# Patient Record
Sex: Male | Born: 1965 | Race: White | Hispanic: No | Marital: Married | Smoking: Former smoker
Health system: Southern US, Community
[De-identification: ages and names within clinical notes are randomized; demographics above are authoritative.]

## PROBLEM LIST (undated history)

## (undated) DIAGNOSIS — I82409 Acute embolism and thrombosis of unspecified deep veins of unspecified lower extremity: Secondary | ICD-10-CM

## (undated) DIAGNOSIS — K219 Gastro-esophageal reflux disease without esophagitis: Secondary | ICD-10-CM

## (undated) DIAGNOSIS — E7800 Pure hypercholesterolemia, unspecified: Secondary | ICD-10-CM

## (undated) DIAGNOSIS — R51 Headache: Secondary | ICD-10-CM

## (undated) DIAGNOSIS — R519 Headache, unspecified: Secondary | ICD-10-CM

## (undated) DIAGNOSIS — Z9989 Dependence on other enabling machines and devices: Secondary | ICD-10-CM

## (undated) DIAGNOSIS — G4733 Obstructive sleep apnea (adult) (pediatric): Secondary | ICD-10-CM

## (undated) DIAGNOSIS — N200 Calculus of kidney: Secondary | ICD-10-CM

## (undated) DIAGNOSIS — I10 Essential (primary) hypertension: Secondary | ICD-10-CM

## (undated) HISTORY — DX: Headache, unspecified: R51.9

## (undated) HISTORY — PX: CYSTOSCOPY/RETROGRADE/URETEROSCOPY/STONE EXTRACTION WITH BASKET: SHX5317

## (undated) HISTORY — PX: BACK SURGERY: SHX140

## (undated) HISTORY — DX: Headache: R51

## (undated) HISTORY — PX: COLONOSCOPY: SHX174

---

## 1998-04-28 ENCOUNTER — Encounter: Admission: RE | Admit: 1998-04-28 | Discharge: 1998-07-27 | Payer: Self-pay | Admitting: *Deleted

## 2003-03-01 ENCOUNTER — Emergency Department (HOSPITAL_COMMUNITY): Admission: EM | Admit: 2003-03-01 | Discharge: 2003-03-02 | Payer: Self-pay

## 2003-03-10 ENCOUNTER — Encounter: Payer: Self-pay | Admitting: Orthopedic Surgery

## 2003-03-10 ENCOUNTER — Ambulatory Visit (HOSPITAL_COMMUNITY): Admission: RE | Admit: 2003-03-10 | Discharge: 2003-03-10 | Payer: Self-pay | Admitting: Orthopedic Surgery

## 2003-03-29 ENCOUNTER — Ambulatory Visit: Admission: RE | Admit: 2003-03-29 | Discharge: 2003-03-29 | Payer: Self-pay | Admitting: Orthopedic Surgery

## 2003-04-02 ENCOUNTER — Emergency Department (HOSPITAL_COMMUNITY): Admission: EM | Admit: 2003-04-02 | Discharge: 2003-04-02 | Payer: Self-pay

## 2003-04-06 ENCOUNTER — Encounter: Payer: Self-pay | Admitting: Urology

## 2003-04-06 ENCOUNTER — Inpatient Hospital Stay (HOSPITAL_COMMUNITY): Admission: EM | Admit: 2003-04-06 | Discharge: 2003-04-07 | Payer: Self-pay | Admitting: Emergency Medicine

## 2003-04-06 ENCOUNTER — Encounter: Payer: Self-pay | Admitting: Emergency Medicine

## 2003-04-06 ENCOUNTER — Encounter: Payer: Self-pay | Admitting: Internal Medicine

## 2005-06-03 ENCOUNTER — Emergency Department (HOSPITAL_COMMUNITY): Admission: EM | Admit: 2005-06-03 | Discharge: 2005-06-03 | Payer: Self-pay | Admitting: Family Medicine

## 2005-07-05 ENCOUNTER — Ambulatory Visit (HOSPITAL_COMMUNITY): Admission: RE | Admit: 2005-07-05 | Discharge: 2005-07-05 | Payer: Self-pay | Admitting: Orthopedic Surgery

## 2005-08-19 ENCOUNTER — Encounter: Admission: RE | Admit: 2005-08-19 | Discharge: 2005-11-08 | Payer: Self-pay | Admitting: Orthopedic Surgery

## 2005-09-05 HISTORY — PX: SHOULDER ARTHROSCOPY: SHX128

## 2005-09-25 ENCOUNTER — Ambulatory Visit (HOSPITAL_BASED_OUTPATIENT_CLINIC_OR_DEPARTMENT_OTHER): Admission: RE | Admit: 2005-09-25 | Discharge: 2005-09-25 | Payer: Self-pay | Admitting: Orthopedic Surgery

## 2006-08-06 ENCOUNTER — Encounter: Admission: RE | Admit: 2006-08-06 | Discharge: 2006-08-06 | Payer: Self-pay | Admitting: Internal Medicine

## 2006-08-20 ENCOUNTER — Ambulatory Visit (HOSPITAL_COMMUNITY): Admission: RE | Admit: 2006-08-20 | Discharge: 2006-08-20 | Payer: Self-pay | Admitting: Internal Medicine

## 2006-09-09 ENCOUNTER — Encounter: Admission: RE | Admit: 2006-09-09 | Discharge: 2006-09-09 | Payer: Self-pay | Admitting: Internal Medicine

## 2007-05-29 ENCOUNTER — Encounter: Admission: RE | Admit: 2007-05-29 | Discharge: 2007-05-29 | Payer: Self-pay | Admitting: Internal Medicine

## 2007-06-08 HISTORY — PX: ANTERIOR CERVICAL DECOMP/DISCECTOMY FUSION: SHX1161

## 2007-07-07 ENCOUNTER — Ambulatory Visit (HOSPITAL_COMMUNITY): Admission: RE | Admit: 2007-07-07 | Discharge: 2007-07-08 | Payer: Self-pay | Admitting: Neurosurgery

## 2007-10-28 ENCOUNTER — Encounter: Admission: RE | Admit: 2007-10-28 | Discharge: 2007-10-28 | Payer: Self-pay | Admitting: Internal Medicine

## 2009-04-06 ENCOUNTER — Emergency Department (HOSPITAL_COMMUNITY): Admission: EM | Admit: 2009-04-06 | Discharge: 2009-04-06 | Payer: Self-pay | Admitting: Emergency Medicine

## 2009-04-12 ENCOUNTER — Emergency Department (HOSPITAL_COMMUNITY): Admission: EM | Admit: 2009-04-12 | Discharge: 2009-04-12 | Payer: Self-pay | Admitting: Family Medicine

## 2009-05-02 ENCOUNTER — Encounter: Admission: RE | Admit: 2009-05-02 | Discharge: 2009-05-02 | Payer: Self-pay | Admitting: Internal Medicine

## 2009-05-03 ENCOUNTER — Encounter: Admission: RE | Admit: 2009-05-03 | Discharge: 2009-05-03 | Payer: Self-pay | Admitting: Occupational Medicine

## 2009-05-14 ENCOUNTER — Ambulatory Visit (HOSPITAL_COMMUNITY): Admission: RE | Admit: 2009-05-14 | Discharge: 2009-05-14 | Payer: Self-pay | Admitting: Neurosurgery

## 2009-08-01 ENCOUNTER — Encounter: Admission: RE | Admit: 2009-08-01 | Discharge: 2009-10-30 | Payer: Self-pay | Admitting: Neurosurgery

## 2009-12-09 ENCOUNTER — Emergency Department (HOSPITAL_COMMUNITY): Admission: EM | Admit: 2009-12-09 | Discharge: 2009-12-09 | Payer: Self-pay | Admitting: Emergency Medicine

## 2010-03-05 ENCOUNTER — Ambulatory Visit (HOSPITAL_COMMUNITY): Admission: RE | Admit: 2010-03-05 | Discharge: 2010-03-05 | Payer: Self-pay | Admitting: Gastroenterology

## 2010-07-29 ENCOUNTER — Encounter (HOSPITAL_BASED_OUTPATIENT_CLINIC_OR_DEPARTMENT_OTHER): Payer: Self-pay | Admitting: Internal Medicine

## 2010-10-12 LAB — POCT RAPID STREP A (OFFICE): Streptococcus, Group A Screen (Direct): NEGATIVE

## 2010-11-20 NOTE — Op Note (Signed)
NAMEMarland Kitchen  ELVA, BREAKER NO.:  [id number]   MEDICAL RECORD NO.:  [id number]          PATIENT TYPE:  INP   LOCATION:  3172                         FACILITY:  [facility]   PHYSICIAN:  Danae Orleans. Venetia Maxon, M.D.  DATE OF BIRTH:  1966-07-03   DATE OF PROCEDURE:  07/07/2007  DATE OF DISCHARGE:                               OPERATIVE REPORT   PREOPERATIVE DIAGNOSIS:  Herniated cervical disk with spondylosis,  degenerative disk disease and radiculopathy C5-C6 and C6-C7 levels.   POSTOPERATIVE DIAGNOSIS:  Herniated cervical disk with spondylosis,  degenerative disk disease and radiculopathy C5-C6 and C6-C7 levels.   PROCEDURE:  Anterior cervical decompression and fusion C5-C6 and C6-C7  with allograft bone graft, morselized bone autograft and anterior  cervical plate.   SURGEON:  Maeola Harman, MD.   ASSISTANT:  Coletta Memos, MD and Georgiann Cocker, RN.   ANESTHESIA:  General endotracheal anesthesia.   BLOOD LOSS:  Minimal.   COMPLICATIONS:  None.   DISPOSITION:  Recovery.   INDICATIONS:  Dan Scearce is a 45 year old man with right arm pain and  herniated cervical disks at C5-C6 and C6-C7 levels.  It was elected take  him to surgery for anterior cervical decompression and fusion at these  affected levels.   PROCEDURE:  Mr. Archey is brought to the operating room.  Following  satisfactory and uncomplicated induction of general endotracheal  anesthesia and placement of intravenous lines, the patient was placed in  the supine position on the operating table.  His neck was placed in  slight extension.  He was placed in 10 pounds halter traction and the  anterior neck was then prepped and draped in usual sterile fashion.  Because the PACS radiology system was not available, intraoperative C-  arm fluoroscopy was used to confirm the level and placement of hardware.  The area of planned incision was marked and confirmed on this location  on x-ray.  Subsequently, an incision was  made from the midline to the  anterior border of the sternocleidomastoid muscle, carried sharply  through platysma layer.  Subplatysmal dissection was performed exposing  the anterior border sternocleidomastoid muscle using blunt dissection  where the carotid sheath was kept lateral and the trachea and esophagus  kept medial exposing the anterior cervical spine.  A bent spinal needle  was placed at the C5-C6 level and this was confirmed on intraoperative x-  ray.  Subsequently, longus colli muscles were taken down from the  anterior cervical spine using electrocautery and Key elevator and  subsequently shadow line retractors were placed exposing the C5-C6 and  C6-C7 levels.  Each of the interspaces were incised, disk material was  removed in piecemeal fashion and endplates were stripped of residual  disk material.  The distraction pins were then placed, one at C6 and one  at C7 and using gentle distraction the interspace at C6-C7 was then  opened using a high-speed drill under the operating microscope.  The  endplates were decorticated and uncinate spurs drilled down.  The  posterior longitudinal ligament was then removed in a piecemeal fashion.  The endplates were cleared of  residual disk material.  The right C7  nerve root was decompressed.  There was cephalad herniated disk material  which was causing significant narrowing around the nerve root and this  was fully decompressed.  The left C6-C7 foramen did not appear to have  nearly as much compression.  This was also decompressed.  Hemostasis was  assured and then after trial sizing an 8 mm bone allograft which was  fashioned with a high-speed drill, packed with morcellized bone  autograft and demineralized bone matrix inserted in the interspace and  countersunk appropriately.  Attention was then turned to the C5-C6 level  where similar decompression was performed.  On this side there was  fragment of disk material to the right which  was causing a right C6  nerve root compression and the nerve root was decompressed as was the  central spinal cord dura and left C6 nerve root as well.  Hemostasis was  again assured and then after trial sizing, a 7 mm allograft bone wedge  was selected fashioned with a high-speed drill, packed morselized bone  autograft and demineralized bone matrix inserted in the interspace and  countersunk appropriately.  Traction weight was removed.  A 34 mm  Trestle anterior cervical plate was then affixed to the anterior  cervical spine using variable angle 14 mm screws, two at C5, two at C6  and two at C7.  All screws had excellent purchase.  Their position was  confirmed on lateral foot C-arm fluoroscopy which visualized the upper  aspect of the implant.  The wound was then inspected.  Soft tissues were  inspected, found to be in good repair.  Hemostasis was assured.  The  platysma layer was then closed with 3-0 Vicryl sutures.  Skin edges were  approximated 3-0 Vicryl interrupted inverted sutures.  The wound was  dressed with Dermabond.  The patient was extubated in the operating room  and taken to recovery in stable satisfactory condition having tolerated  his operation well.  All counts were correct.      Danae Orleans. Venetia Maxon, M.D.  Electronically Signed     JDS/MEDQ  D:  07/07/2007  T:  07/07/2007  Job:  [id number]

## 2010-11-23 NOTE — Op Note (Signed)
NAME:  Donald Hoover, Donald Hoover                         ACCOUNT NO.:  [id number]   MEDICAL RECORD NO.:  [id number]                   PATIENT TYPE:  INP   LOCATION:  0381                                 FACILITY:  [facility]   PHYSICIAN:  Lucrezia Starch. Ovidio Hanger, M.D.           DATE OF BIRTH:  Apr 29, 1966   DATE OF PROCEDURE:  04/06/2003  DATE OF DISCHARGE:                                 OPERATIVE REPORT   PREOPERATIVE DIAGNOSES:  Left ureterolithiasis with high-grade obstruction.   OPERATION/PROCEDURE:  1. Cystourethroscopy with left ureteroscopy with holmium laser lithotripsy     and stone basket extraction.  2. Placement left double-J stent.   SURGEON:  Lucrezia Starch. Earlene Plater, M.D.   ANESTHESIA:  LMA.   ESTIMATED BLOOD LOSS:  Negligible.   TUBES:  26 cm, 6-French Cook double pigtail stent.   COMPLICATIONS:  None.   INDICATIONS:  [name] is a very nice 45 year old white male who recently  had a heal fracture and developed a DVT and has been maintained on Coumadin.  He subsequently developed left flank pain, nausea and vomiting and was seen  the first of the week and was found to have what was felt to be a 4-6 mm  left distal ureteral calculus.  He was in an attempt to pass the stone,  especially being on the Coumadin.  He had only been on Coumadin for  approximately a week and needed to stay on it for a period of time for his  DVT and last night developed severe pain, nausea, vomiting, and was seen in  the emergency room and had continued pain despite IV Dilaudid.  He was  admitted.  With the continued pain, it was felt that stone manipulation was  indicated.  His liver enzymes were somewhat elevated.  After being worked  up, his mediastinum was slightly widened on chest x-ray.  That is also being  worked up.  Of note, his PT was only 15.3 and INR was 1.3, PTT 45.  It was  felt that it was safe to proceed.   DESCRIPTION OF PROCEDURE:  The patient was placed in the supine position.  After  proper LMA anesthesia, he was placed in the dorsal lithotomy position  and prepped and draped with Betadine in the sterile fashion.  Care was  maintained with his heel fracture.  Cystourethroscopy was performed with a  22.5-French Olympus panendoscope.  There was some mild trilobar hypertrophy  of the bladder, smooth wall and efflux of urine was noted in the right  ureteral orifice.  Under fluoroscopic guidance, a 0.03 [place] ______ coated  guidewire was placed, passed the stone on the left side which was quite  distal and into the kidney and ureteroscopy was then performed with a short,  thin ACMI ureteroscope.  The orifice was carefully dilated with the tip of  the scope and the stone was visualized.  Utilizing a 365 laser fiber on  a  setting of 0.5 and repetition rate of 5, stone was visually fragmented on  the monitor and the fragments were extracted with a nitinol basket.  Reinspection of the lower third ureter revealed no perforation, no  significant bleeding and no significant stone fragments.  The ureteroscope  was visually removed.  It was felt that retrograde pyelogram was probably  not indicated and under fluoroscopic guidance, a 26 cm, 6-French Cook double  pigtail stent was placed and noted to be in good position in the left renal  pelvis within the bladder.  Additional a pull-out string was attached.  The bladder was drained,  panendoscope was removed.  Fluoroscopic confirmation was performed.  The  string was taped to the penis and the patient was taken to the recovery room  stable and stone will be submitted for stone analysis.                                               Ronald L. Ovidio Hanger, M.D.    RLD/MEDQ  D:  04/06/2003  T:  04/06/2003  Job:  [id number]   cc:   Barry Dienes. Eloise Harman, M.D.  [address]  [place]  [place] [address]  Fax: [phone]

## 2010-11-23 NOTE — Op Note (Signed)
NAME:  Donald Hoover, Donald Hoover               ACCOUNT NO.:  [id number]   MEDICAL RECORD NO.:  [id number]          PATIENT TYPE:  AMB   LOCATION:  DSC                          FACILITY:  [facility]   PHYSICIAN:  Mila Homer. Sherlean Foot, M.D. DATE OF BIRTH:  [birth year]   DATE OF PROCEDURE:  09/25/2005  DATE OF DISCHARGE:                                 OPERATIVE REPORT   PREOPERATIVE DIAGNOSIS:  Left shoulder impingement syndrome.   POSTOPERATIVE DIAGNOSIS:  Left shoulder impingement syndrome.   OPERATION PERFORMED:  Left shoulder arthroscopy, subacromial decompression  and distal clavicle resection.   SURGEON:  Mila Homer. Sherlean Foot, M.D.   ASSISTANT:  None.   ANESTHESIA:  General.   INDICATIONS FOR PROCEDURE:  The patient is a white male with failure of  conservative measures for shoulder impingement syndrome.  Informed consent  was obtained.   DESCRIPTION OF PROCEDURE:  The patient was laid supine and administered  general anesthesia.  I then placed trocar and cannula.  Diagnostic  arthroscopy of the glenohumeral joint from the posterior portal revealed no  labral tears, no pathology of really any kind.  I then redirected the scope  to the subacromial space from the posterior portal.  There was some mild  bursitis.  The shaver was used through the direct lateral portal and  performed __________ estimated at 6 mm in total with the arm sitting at  comfortable resting position.  I released the CA ligament and cleaned off  the undersurface of the acromion with the Arthrex shoulder debridement.  I  then used a 4.0 mm cylindrical bur to perform an aggressive anterolateral  __________ to the [facility] joint and removing the distal clavicle on the inferior  surface.  This afforded excellent decompression.  I then irrigated, closed  with 4-0 nylon sutures, dressed with Xeroform, 4 x 4s, ABDs, 2-inch silk  tape and simple sling.   COMPLICATIONS:  None.   DRAINS:  None.           ______________________________  Mila Homer. Sherlean Foot, M.D.     SDL/MEDQ  D:  09/25/2005  T:  09/26/2005  Job:  [id number]

## 2010-11-23 NOTE — Consult Note (Signed)
NAME:  Donald Hoover, Donald Hoover                         ACCOUNT NO.:  [id number]   MEDICAL RECORD NO.:  [id number]                   PATIENT TYPE:  INP   LOCATION:  0381                                 FACILITY:  [facility]   PHYSICIAN:  Donald Hoover, M.D.                DATE OF BIRTH:  [birth year]   DATE OF CONSULTATION:  04/06/2003  DATE OF DISCHARGE:                                   CONSULTATION   REQUESTING PHYSICIAN:  Donald Hoover, M.D. of urology.   CHIEF COMPLAINT:  Kidney stone.  Cleared medically for surgery.   HISTORY OF PRESENT ILLNESS:  Donald Hoover is a 45 year old male who was  previously healthy.  He does have a history of hyperlipidemia and had  previously been on Lipitor for six months and stopped this on his own six  months ago.  He fell off a ladder four or five weeks ago and had a  comminuted fracture of his left heel.  He around the same time developed a  sore throat and was treated with Augmentin.  Since that time, he has had  loose watery diarrhea and was diagnosed in the past week with C. diff  colitis.  He was started on vancomycin one or two days ago.  In addition, he  was noted to have some increased calf swelling and he had a Doppler  ultrasound of his left calf within the last week.  This showed a calf DVT,  and he has been placed on Lovenox and Coumadin approximately one week ago.  Then more recently, he has developed some urinary symptoms and left flank  pain.  This became severe and he presented to the emergency room over the  weekend.  He was found to have an obstructing 6 mm left ureteral stone.  He  is admitted today for an ureteroscopy and hopeful removal of the kidney  stone.  We were asked to see him to clear medically and to address his  medical issues.   PAST MEDICAL HISTORY:  1. Left foot calcaneal fracture.  2. He denies history of hypertension, diabetes.  He has had no history of     heart disease or stroke.  He does have a history of  hyperlipidemia and he     was on Lipitor for a time.  3. He had a previous finger surgery, but no other surgeries.  He has never     had a hospitalization before.   ALLERGIES:  No known allergies.   MEDICATIONS:  1. Morphine PCA.  2. At home, he was taking Coumadin 5 mg daily and Lovenox 100 mg subcu twice     a day.  He had been on Dilaudid taking 4 mg every three to four hours for     the last several days.  He was just started on vancomycin 125 mg q.i.d.     one day ago.  SOCIAL HISTORY:  His wife, Donald Hoover, is a Engineer, civil (consulting).  They have two children, a  daughter who is 14, and a son who is 74, they are in good health.  No  tobacco use except maybe a rare cigar.  He drinks about one case of beer per  month, but has had no alcohol in the last month.  He denies any recreational  or illicit drugs.  He was using Aleve two tablets twice a day for 10 days  initially after his heel fracture, then he had been off of it, then he had  been using it again for three or four days in the last week.   FAMILY HISTORY:  Mother died at age 82 of lung cancer, she was a heavy  smoker.  Father is alive at age 21, but he did have a stroke at age 59, and  has a history of carotid endarterectomy at age 27 with a repeat several  years ago, and a fem/pop bypass in the year 2000.  He has three brothers and  four sisters.  There is no known coronary disease in any of his brothers or  sisters.   REVIEW OF SYSTEMS:  He has been having 10 stools per day, although this has  slowed some with the addition of the Dilaudid over the few days.  He has had  no chest pain or shortness of breath.  He has had normal appetite.  He has  had some low-grade temperatures ranging from 100 to 100.4 for the last few  days.  He denies any current shortness of breath, sore throat, cough, or  cold symptoms.  He has had some mild swelling of his left foot and ankle,  but he has been neurovascularly intact.   PHYSICAL EXAMINATION:  VITAL  SIGNS:  Temperature 98.4, pulse 86, respiratory  rate 16, blood pressure 134/75, 96% saturation on room air, weight 210  pounds.  GENERAL:  He is in no acute distress.  He is alert and oriented.  HEENT:  There is no icterus.  NECK:  No JVD.  LUNGS:  Clear to auscultation bilaterally.  HEART:  Regular rate and rhythm with no murmur.  ABDOMEN:  Soft.  He has a little bit of left lower quadrant tenderness, but  no rebound or guarding, no masses.  EXTREMITIES:  There is no significant edema.  He has normal pulses  bilaterally.  He has some maturing ecchymoses of his left foot and heel.   LABORATORY DATA:  CT of the abdomen and pelvis done on April 02, 2003,  shows moderate left hydronephrosis, a 2 mm left kidney stone, and a 6 mm  distal ureteral stone.  He also had some mild diverticulosis changes.  Laboratory data today shows a PTT of 45, INR 1.3.  Sodium 140, potassium  3.5, chloride 106, CO2 24, BUN 11, creatinine 1.7, glucose 115, alk phos  122, AST 141, ALT 341, total protein of 7.4, albumin 3.9, calcium 9.3, white  count is 9.5 with 74% segs, 16% lymphocytes, 8% monocytes.  Hemoglobin 14,  platelet count 278,000.  Urinalysis showed 0 to 2 white cells, 21 to 50 red  cells, few bacteria.  Dip stick showed large blood and small leukocyte.  EKG  shows normal sinus rhythm, there is an isolated Q-wave in lead 3, but no  contiguous lead Q-waves.  He has some mild nonspecific flattening of his T-  waves in lead 3, AVF, but no clearcut ischemic changes.  Chest x-ray is  being done currently.   ASSESSMENT AND PLAN:  A previously healthy 45 year old male with left heel  fracture, left calf deep vein thrombosis, Clostridium difficile colitis,  left kidney stones, and abnormal liver function tests.  The first order of  business is that I believe that the patient is cleared medically for procedure today.  His EKG does show some mild nonspecific findings, but he  has had no definite ischemic  symptoms or signs.  I would recommend resuming  his Lovenox this afternoon if this is okay with urology or possibly give him  a heparin drip and resume Coumadin when it is deemed safe per urology.  This  will address his calf deep vein thrombosis.  I would continue him on oral  vancomycin for his recently diagnosed Clostridium difficile colitis.  As far  as his elevated liver function tests are concerned, I believe this may be  due to his recent Aleve use and possibly due to the Dilaudid.  We will check  acute and chronic viral serologies and check an abdominal ultrasound.  It  should be noted that a CT scan of his abdomen done four days ago was  negative for any liver or gallbladder findings.  I would follow liver  function tests again in the morning.  Dr. Eloise Harman will be made aware of the  patient's admission and will follow with you.                                               Donald A. Waynard Edwards, M.D.    MAP/MEDQ  D:  04/06/2003  T:  04/06/2003  Job:  [id number]

## 2011-04-12 LAB — CBC
HCT: 41.2
Hemoglobin: 14.4
MCHC: 35.1
MCV: 83.7
Platelets: 227
RBC: 4.92
RDW: 12.9
WBC: 5.8

## 2011-04-12 LAB — BASIC METABOLIC PANEL
BUN: 16
CO2: 27
Calcium: 9.8
Chloride: 106
Creatinine, Ser: 0.95
GFR calc Af Amer: >60
GFR calc non Af Amer: >60
Glucose, Bld: 108 — ABNORMAL HIGH
Potassium: 4.2
Sodium: 139

## 2011-04-12 LAB — TYPE AND SCREEN
ABO/RH(D): O POS
Antibody Screen: NEGATIVE

## 2011-04-12 LAB — ABO/RH: ABO/RH(D): O POS

## 2011-12-20 ENCOUNTER — Ambulatory Visit (HOSPITAL_BASED_OUTPATIENT_CLINIC_OR_DEPARTMENT_OTHER): Payer: 59 | Attending: Internal Medicine | Admitting: Radiology

## 2011-12-20 VITALS — Ht 72.0 in | Wt 225.0 lb

## 2011-12-20 DIAGNOSIS — I498 Other specified cardiac arrhythmias: Secondary | ICD-10-CM | POA: Insufficient documentation

## 2011-12-20 DIAGNOSIS — G4737 Central sleep apnea in conditions classified elsewhere: Secondary | ICD-10-CM | POA: Insufficient documentation

## 2011-12-20 DIAGNOSIS — R0683 Snoring: Secondary | ICD-10-CM

## 2011-12-20 DIAGNOSIS — G4733 Obstructive sleep apnea (adult) (pediatric): Secondary | ICD-10-CM | POA: Insufficient documentation

## 2011-12-20 DIAGNOSIS — G4730 Sleep apnea, unspecified: Secondary | ICD-10-CM

## 2011-12-21 DIAGNOSIS — I498 Other specified cardiac arrhythmias: Secondary | ICD-10-CM

## 2011-12-21 DIAGNOSIS — G4733 Obstructive sleep apnea (adult) (pediatric): Secondary | ICD-10-CM

## 2011-12-21 DIAGNOSIS — G4737 Central sleep apnea in conditions classified elsewhere: Secondary | ICD-10-CM

## 2011-12-21 DIAGNOSIS — R0989 Other specified symptoms and signs involving the circulatory and respiratory systems: Secondary | ICD-10-CM

## 2011-12-21 DIAGNOSIS — R0609 Other forms of dyspnea: Secondary | ICD-10-CM

## 2011-12-21 DIAGNOSIS — G4761 Periodic limb movement disorder: Secondary | ICD-10-CM

## 2011-12-21 NOTE — Procedures (Deleted)
NAME:  Donald Hoover, Donald Hoover               ACCOUNT NO.:  [id number]  MEDICAL RECORD NO.:  [id number]          PATIENT TYPE:  OUT  LOCATION:  SLEEP CENTER                 FACILITY:  [facility]  PHYSICIAN:  Shoshana Johal D. Denim Kalmbach, MD, FCCP, FACPDATE OF BIRTH:  02/18/1966  DATE OF STUDY:  12/20/2011                           NOCTURNAL POLYSOMNOGRAM  REFERRING PHYSICIAN:  Daniel G. Paterson, M.D.  INDICATION FOR STUDY:  Hypersomnia with sleep apnea.  EPWORTH SLEEPINESS SCORE:  14/24.  BMI 30.5, weight 225 pounds, height 72 inches, neck 17.5 inches.  MEDICATIONS:  Home medications are charted and reviewed.  SLEEP ARCHITECTURE:  Total sleep time 325.5 minutes with sleep efficiency 74.5%.  Stage I was 13.2%, stage II 73%, stage III absent, REM 13.8% of total sleep time.  Sleep latency 22 minutes, REM latency 121 minutes, awake after sleep onset 89 minutes.  Arousal index 36.7.  BEDTIME MEDICATION:  None.  RESPIRATORY DATA:  Apnea-hypopnea index (AHI) 28.8 per hour.  A total of 156 events was scored including 59 obstructive apneas, 21 central apneas, 76 hypopneas.  The events were associated with supine sleep position and REM.  REM/AHI 22.7 per hour.  This was a diagnostic NPSG protocol as ordered and CPAP titration was not done.  OXYGEN DATA:  Moderate to loud snoring with oxygen desaturation to a nadir of 82% and mean oxygen saturation through the study of 94.1% on room air.  CARDIAC DATA:  Sinus rhythm with sinus bradycardia.  Mean heart rate through the study was 58.9 per minute.  MOVEMENT-PARASOMNIA:  A total of 352 limb jerks were counted of which 22 were associated with arousal or awakening for a periodic limb movement with arousal index of 4.1 per hour.  Bathroom x1.  IMPRESSIONS-RECOMMENDATIONS: 1. 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  REM/AHI 22.7     per hour.  Moderate to loud snoring with oxygen  desaturation to a     nadir of 82% and mean oxygen saturation through the study of 94.1%     on room air. 2. This is a diagnostic NPSG protocol as ordered.  Return for     dedicated CPAP titration is an option. 3. Periodic limb movement with arousal syndrome.  A total of 352 limb     jerks were counted of which 22 were     associated with arousals or awakening for periodic limb movement     with arousal index of 4.1 per hour. 4. Sustained sinus bradycardia with mean heart rate through the study     of 58.9 per hour.     Lasonia Casino D. Aston Lieske, MD, FCCP, FACP Diplomate, American Board of Sleep Medicine    CDY/MEDQ  D:  12/21/2011 11:39:11  T:  12/21/2011 19:50:19  Job:  [id number] 

## 2011-12-21 NOTE — Procedures (Signed)
NAME:  Donald Hoover, Donald Hoover NO.:  [id number]  MEDICAL RECORD NO.:  [id number]          PATIENT TYPE:  OUT  LOCATION:  SLEEP CENTER                 FACILITY:  [facility]  PHYSICIAN:  Izyk Marty D. Maple Hudson, MD, FCCP, FACPDATE OF BIRTH:  06/28/1966  DATE OF STUDY:  12/20/2011                           NOCTURNAL POLYSOMNOGRAM  REFERRING PHYSICIAN:  Barry Dienes. Eloise Harman, M.D.  INDICATION FOR STUDY:  Hypersomnia with sleep apnea.  EPWORTH SLEEPINESS SCORE:  14/24.  BMI 30.5, weight 225 pounds, height 72 inches, neck 17.5 inches.  MEDICATIONS:  Home medications are charted and reviewed.  SLEEP ARCHITECTURE:  Total sleep time 325.5 minutes with sleep efficiency 74.5%.  Stage I was 13.2%, stage II 73%, stage III absent, REM 13.8% of total sleep time.  Sleep latency 22 minutes, REM latency 121 minutes, awake after sleep onset 89 minutes.  Arousal index 36.7.  BEDTIME MEDICATION:  None.  RESPIRATORY DATA:  Apnea-hypopnea index (AHI) 28.8 per hour.  A total of 156 events was scored including 59 obstructive apneas, 21 central apneas, 76 hypopneas.  The events were associated with supine sleep position and REM.  REM/AHI 22.7 per hour.  This was a diagnostic NPSG protocol as ordered and CPAP titration was not done.  OXYGEN DATA:  Moderate to loud snoring with oxygen desaturation to a nadir of 82% and mean oxygen saturation through the study of 94.1% on room air.  CARDIAC DATA:  Sinus rhythm with sinus bradycardia.  Mean heart rate through the study was 58.9 per minute.  MOVEMENT-PARASOMNIA:  A total of 352 limb jerks were counted of which 22 were associated with arousal or awakening for a periodic limb movement with arousal index of 4.1 per hour.  Bathroom x1.  IMPRESSIONS-RECOMMENDATIONS: 1. 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  REM/AHI 22.7     per hour.  Moderate to loud snoring with oxygen  desaturation to a     nadir of 82% and mean oxygen saturation through the study of 94.1%     on room air. 2. This is a diagnostic NPSG protocol as ordered.  Return for     dedicated CPAP titration is an option. 3. Periodic limb movement with arousal syndrome.  A total of 352 limb     jerks were counted of which 22 were     associated with arousals or awakening for periodic limb movement     with arousal index of 4.1 per hour. 4. Sustained sinus bradycardia with mean heart rate through the study     of 58.9 per hour.     Emilina Smarr D. Maple Hudson, MD, [facility], FACP Diplomate, American Board of Sleep Medicine    CDY/MEDQ  D:  12/21/2011 11:39:11  T:  12/21/2011 19:50:19  Job:  [id number]

## 2011-12-23 ENCOUNTER — Ambulatory Visit (HOSPITAL_COMMUNITY)
Admission: RE | Admit: 2011-12-23 | Discharge: 2011-12-23 | Disposition: A | Discharge status: Home / Self Care | Payer: 59 | Source: Ambulatory Visit | Attending: Gastroenterology | Admitting: Gastroenterology

## 2011-12-23 ENCOUNTER — Encounter (HOSPITAL_COMMUNITY): Payer: Self-pay

## 2011-12-23 ENCOUNTER — Encounter (HOSPITAL_COMMUNITY)
Admission: RE | Disposition: A | Discharge status: Home / Self Care | Payer: Self-pay | Source: Ambulatory Visit | Attending: Gastroenterology

## 2011-12-23 DIAGNOSIS — Z79899 Other long term (current) drug therapy: Secondary | ICD-10-CM | POA: Insufficient documentation

## 2011-12-23 DIAGNOSIS — E785 Hyperlipidemia, unspecified: Secondary | ICD-10-CM | POA: Insufficient documentation

## 2011-12-23 DIAGNOSIS — K449 Diaphragmatic hernia without obstruction or gangrene: Secondary | ICD-10-CM | POA: Insufficient documentation

## 2011-12-23 DIAGNOSIS — I10 Essential (primary) hypertension: Secondary | ICD-10-CM | POA: Insufficient documentation

## 2011-12-23 DIAGNOSIS — K222 Esophageal obstruction: Secondary | ICD-10-CM | POA: Insufficient documentation

## 2011-12-23 DIAGNOSIS — R1310 Dysphagia, unspecified: Secondary | ICD-10-CM | POA: Insufficient documentation

## 2011-12-23 DIAGNOSIS — K219 Gastro-esophageal reflux disease without esophagitis: Secondary | ICD-10-CM | POA: Insufficient documentation

## 2011-12-23 HISTORY — DX: Essential (primary) hypertension: I10

## 2011-12-23 HISTORY — PX: BALLOON DILATION: SHX5330

## 2011-12-23 HISTORY — DX: Acute embolism and thrombosis of unspecified deep veins of unspecified lower extremity: I82.409

## 2011-12-23 HISTORY — DX: Gastro-esophageal reflux disease without esophagitis: K21.9

## 2011-12-23 HISTORY — PX: ESOPHAGOGASTRODUODENOSCOPY: SHX5428

## 2011-12-23 HISTORY — DX: Pure hypercholesterolemia, unspecified: E78.00

## 2011-12-23 SURGERY — EGD (ESOPHAGOGASTRODUODENOSCOPY)
Anesthesia: Moderate Sedation

## 2011-12-23 MED ORDER — FENTANYL CITRATE 0.05 MG/ML IJ SOLN
INTRAMUSCULAR | Status: AC
  Filled 2011-12-23: qty 4

## 2011-12-23 MED ORDER — FENTANYL NICU IV SYRINGE 50 MCG/ML
INJECTION | INTRAMUSCULAR | Status: DC | PRN
  Administered 2011-12-23 (×3): 25 ug via INTRAVENOUS

## 2011-12-23 MED ORDER — DIPHENHYDRAMINE HCL 50 MG/ML IJ SOLN
INTRAMUSCULAR | Status: DC | PRN
  Administered 2011-12-23: 25 mg via INTRAVENOUS

## 2011-12-23 MED ORDER — MIDAZOLAM HCL 10 MG/2ML IJ SOLN
INTRAMUSCULAR | Status: AC
  Filled 2011-12-23: qty 4

## 2011-12-23 MED ORDER — DIPHENHYDRAMINE HCL 50 MG/ML IJ SOLN
INTRAMUSCULAR | Status: AC
  Filled 2011-12-23: qty 1

## 2011-12-23 MED ORDER — ESOMEPRAZOLE MAGNESIUM 40 MG PO CPDR: 40.0000 mg | DELAYED_RELEASE_CAPSULE | Freq: Two times a day (BID) | ORAL | Status: DC

## 2011-12-23 MED ORDER — MIDAZOLAM HCL 10 MG/2ML IJ SOLN
INTRAMUSCULAR | Status: DC | PRN
  Administered 2011-12-23 (×3): 2 mg via INTRAVENOUS

## 2011-12-23 MED ORDER — SODIUM CHLORIDE 0.9 % IV SOLN
Freq: Once | INTRAVENOUS | Status: AC
  Administered 2011-12-23: 500 mL via INTRAVENOUS

## 2011-12-23 MED ORDER — BUTAMBEN-TETRACAINE-BENZOCAINE 2-2-14 % EX AERO
INHALATION_SPRAY | CUTANEOUS | Status: DC | PRN
  Administered 2011-12-23: 2 via TOPICAL

## 2011-12-23 NOTE — Discharge Instructions (Signed)
Endoscopy °Care After °Please read the instructions outlined below and refer to this sheet in the next few weeks. These discharge instructions provide you with general information on caring for yourself after you leave the hospital. Your doctor may also give you specific instructions. While your treatment has been planned according to the most current medical practices available, unavoidable complications occasionally occur. If you have any problems or questions after discharge, please call Dr. Outlaw ([facility]) at [phone]. ° °HOME CARE INSTRUCTIONS °Activity °· You may resume your regular activity but move at a slower pace for the next 24 hours.  °· Take frequent rest periods for the next 24 hours.  °· Walking will help expel (get rid of) the air and reduce the bloated feeling in your abdomen.  °· No driving for 24 hours (because of the anesthesia (medicine) used during the test).  °· You may shower.  °· Do not sign any important legal documents or operate any machinery for 24 hours (because of the anesthesia used during the test).  °Nutrition °· Drink plenty of fluids.  °· You may resume your normal diet.  °· Begin with a light meal and progress to your normal diet.  °· Avoid alcoholic beverages for 24 hours or as instructed by your caregiver.  °Medications °You may resume your normal medications unless your caregiver tells you otherwise. °What you can expect today °· You may experience abdominal discomfort such as a feeling of fullness or "gas" pains.  °· You may experience a sore throat for 2 to 3 days. This is normal. Gargling with salt water may help this.  °·  °SEEK IMMEDIATE MEDICAL CARE IF: °· You have excessive nausea (feeling sick to your stomach) and/or vomiting.  °· You have severe abdominal pain and distention (swelling).  °· You have trouble swallowing.  °· You have a temperature over 100° F (37.8° C).  °· You have rectal bleeding or vomiting of blood.  °Document Released:  02/06/2004 Document Revised: 03/06/2011 Document Reviewed: 08/19/2007 °ExitCare® Patient Information ©2012 [employer]. °

## 2011-12-23 NOTE — Op Note (Signed)
[facility] [address] [place], [place]  [address]  ENDOSCOPY PROCEDURE REPORT  PATIENT:  Donald Hoover, Donald Hoover  MR#:  [id number] BIRTHDATE:  18-Jun-1966, 46 yrs. old  GENDER:  male  ENDOSCOPIST:  Willis Modena, MD Referred by:  Jarome Matin, M.D.  PROCEDURE DATE:  12/23/2011 PROCEDURE:  EGD with balloon dilatation ASA CLASS:  Class II INDICATIONS:  GERD, dysphagia  MEDICATIONS:   Benadryl 25 mg IV, Versed 6 mg IV, Fentanyl 75 mcg IV, Cetacaine spray x 2  DESCRIPTION OF PROCEDURE:   After the risks benefits and alternatives of the procedure were thoroughly explained, informed consent was obtained.  The Pentax Gastroscope [id number] endoscope was introduced through the mouth and advanced to the second portion of the duodenum, without limitations.  The instrument was slowly withdrawn as the mucosa was fully examined.  <<PROCEDUREIMAGES>>  FINDINGS:  Small hiatal hernia with widely patent Schatzki's ring; after conclusion of our diagnostic exam (as below), the ring was dilated to 18mm with TTS balloon dilating catheter; there was minimal resistance and no mucosal disruption post dilatation. Remainder of exam was normal; normal remainder of esophagus (no Barrett's mucosa, esophagitis, features of eosinophilic esophagitis), stomach, pylorus and duodenum to the second portion.  ENDOSCOPIC IMPRESSION:    1.  Widely patent Schatzki's ring, dilation to 18mm with minimal resistance. 2.  Small hiatal hernia. 3.  Otherwise normal endoscopy.  Suspect dysphagia is largely GERD-related.  RECOMMENDATIONS:      1.  Watch for potential complications of procedure. 2.  Conservative antireflux measures. 3.  Continued PPI therapy. 4.  Follow-up in office in 4-6 weeks.  REPEAT EXAM:  No  ______________________________ Willis Modena  CC:  n. eSIGNEDWillis Modena at 12/23/2011 09:25 AM  Freddie Apley, [phone]

## 2011-12-23 NOTE — H&P (Signed)
Patient interval history reviewed.  Patient examined again.  There has been no change from documented H/P dated 11/22/11 (scanned into chart from our office) except as documented above.  CC:  GERD, dysphagia  HPI:  Chronic GERD.  6-8 months of solid-predominant dysphagia.  Some improvement of GERD with increase in Nexium dosing.  PMHx:  HTN, HLD, blood clots, left shoulder surgery, anterior cervical spine surgery with plates  ALL:  NKDA  FHx:  Colon polyps (father)  PE: GEN:  NAD HEENT:  NCAT ABD:  Soft  Assessment: 1.  GERD 2.  Dysphagia.  Plan:  1.  Endoscopy with possible biopsies, possible esophageal dilatation. 2.  Risks (bleeding, infection, bowel perforation that could require surgery, sedation-related changes in cardiopulmonary systems), benefits (identification and possible treatment of source of symptoms, exclusion of certain causes of symptoms), and alternatives (watchful waiting, radiographic imaging studies, empiric medical treatment) of upper endoscopy (EGD) were explained to patient in detail and he wishes to proceed.

## 2011-12-24 ENCOUNTER — Encounter (HOSPITAL_COMMUNITY): Payer: Self-pay | Admitting: Gastroenterology

## 2012-02-16 ENCOUNTER — Ambulatory Visit (HOSPITAL_BASED_OUTPATIENT_CLINIC_OR_DEPARTMENT_OTHER): Payer: 59 | Attending: Internal Medicine

## 2012-02-16 VITALS — Ht 72.0 in | Wt 225.0 lb

## 2012-02-16 DIAGNOSIS — G4710 Hypersomnia, unspecified: Secondary | ICD-10-CM | POA: Insufficient documentation

## 2012-02-16 DIAGNOSIS — R259 Unspecified abnormal involuntary movements: Secondary | ICD-10-CM | POA: Insufficient documentation

## 2012-02-16 DIAGNOSIS — G4733 Obstructive sleep apnea (adult) (pediatric): Secondary | ICD-10-CM

## 2012-02-23 DIAGNOSIS — G4710 Hypersomnia, unspecified: Secondary | ICD-10-CM

## 2012-02-23 DIAGNOSIS — G4730 Sleep apnea, unspecified: Secondary | ICD-10-CM

## 2012-02-24 NOTE — Procedures (Signed)
NAME:  Donald Hoover, Donald Hoover NO.:  [id number]  MEDICAL RECORD NO.:  [id number]          PATIENT TYPE:  OUT  LOCATION:  SLEEP CENTER                 FACILITY[facility]  [facility]  PHYSICIAN:  Machi Whittaker D. Maple Hudson, MD, FCCP, FACPDATE OF BIRTH:  21-Sep-1965  DATE OF STUDY:  02/16/2012                           NOCTURNAL POLYSOMNOGRAM  REFERRING PHYSICIAN:  Barry Dienes. Eloise Harman, M.D.  INDICATION FOR STUDY:  Hypersomnia with sleep apnea.  EPWORTH SLEEPINESS SCORE:  14/24.  BMI 30.5, weight 225 pounds, height 72 inches, neck 18 inches.  MEDICATIONS:  Home medications are charted and reviewed.  The baseline diagnostic NPSG on December 20, 2011, had recorded an AHI of 28.8 per hour.  Body weight was 225 pounds.  CPAP titration is now requested.  SLEEP ARCHITECTURE:  Total sleep time 329.5 minutes with sleep efficiency 89.5%.  Stage I was 6.2%, stage II 68.6%.  Stage III 0.8%, REM 24.4% of total sleep time.  Sleep latency 10.5 minutes, REM latency 80.5 minutes, awake after sleep onset 27.5 minutes.  Arousal index 6.6. Bedtime medication:  None.  RESPIRATORY DATA:  CPAP titration protocol.  CPAP was titrated to 13 CWP, AHI 1.8 per hour.  He wore a [employer], full face mask with heated humidifier and a C-Flex setting of 2.  OXYGEN DATA:  Snoring was presented at final CPAP with mean oxygen saturation 94.8% on room air.  CARDIAC DATA:  Sinus rhythm with occasional PVC.  MOVEMENT-PARASOMNIA:  A total of 350 limb jerks were counted during this CPAP titration study, of which 9 were associated with arousal or awakening for periodic limb movement with arousal index of 1.6 per hour. No bathroom trips.  IMPRESSION-RECOMMENDATION: 1. Successful CPAP titration to 13 CWP, AHI of 1.8 per hour.  He wore     a medium/large Respironics Amara full face mask with heated     humidifier. 2. Frequent limb jerks were noted, of which only a few were associated     with arousals or awakening.   Increased limb jerks are commonly seen     during CPAP titration.  If this pattern persists as a disruptive     issue in the home     sleep environment, then consider specific therapy such as ReQuip or     Mirapex if clinically appropriate. 3. Baseline diagnostic NPSG at December 20, 2011 and recorded an AHI of     28.8 per hour.     Dhillon Comunale D. Maple Hudson, MD, [facility], FACP Diplomate, American Board of Sleep Medicine    CDY/MEDQ  D:  02/23/2012 13:29:25  T:  02/24/2012 03:32:38  Job:  [id number]

## 2012-03-12 ENCOUNTER — Emergency Department (HOSPITAL_COMMUNITY): Payer: 59

## 2012-03-12 ENCOUNTER — Emergency Department (HOSPITAL_COMMUNITY)
Admission: EM | Admit: 2012-03-12 | Discharge: 2012-03-12 | Disposition: A | Discharge status: Home / Self Care | Payer: 59 | Attending: Emergency Medicine | Admitting: Emergency Medicine

## 2012-03-12 ENCOUNTER — Encounter (HOSPITAL_COMMUNITY): Payer: Self-pay | Admitting: Emergency Medicine

## 2012-03-12 DIAGNOSIS — K219 Gastro-esophageal reflux disease without esophagitis: Secondary | ICD-10-CM | POA: Insufficient documentation

## 2012-03-12 DIAGNOSIS — Z86718 Personal history of other venous thrombosis and embolism: Secondary | ICD-10-CM | POA: Insufficient documentation

## 2012-03-12 DIAGNOSIS — E7800 Pure hypercholesterolemia, unspecified: Secondary | ICD-10-CM | POA: Insufficient documentation

## 2012-03-12 DIAGNOSIS — Z7982 Long term (current) use of aspirin: Secondary | ICD-10-CM | POA: Insufficient documentation

## 2012-03-12 DIAGNOSIS — M549 Dorsalgia, unspecified: Secondary | ICD-10-CM | POA: Insufficient documentation

## 2012-03-12 DIAGNOSIS — G4730 Sleep apnea, unspecified: Secondary | ICD-10-CM | POA: Insufficient documentation

## 2012-03-12 DIAGNOSIS — I10 Essential (primary) hypertension: Secondary | ICD-10-CM | POA: Insufficient documentation

## 2012-03-12 DIAGNOSIS — Z79899 Other long term (current) drug therapy: Secondary | ICD-10-CM | POA: Insufficient documentation

## 2012-03-12 MED ORDER — DIAZEPAM 5 MG PO TABS
10.0000 mg | ORAL_TABLET | Freq: Once | ORAL | Status: AC
  Administered 2012-03-12: 10 mg via ORAL
  Filled 2012-03-12: qty 2

## 2012-03-12 MED ORDER — HYDROMORPHONE HCL PF 1 MG/ML IJ SOLN
1.0000 mg | Freq: Once | INTRAMUSCULAR | Status: AC
  Administered 2012-03-12: 1 mg via INTRAVENOUS
  Filled 2012-03-12: qty 1

## 2012-03-12 MED ORDER — ONDANSETRON HCL 4 MG/2ML IJ SOLN
4.0000 mg | Freq: Once | INTRAMUSCULAR | Status: AC
  Administered 2012-03-12: 4 mg via INTRAVENOUS
  Filled 2012-03-12: qty 2

## 2012-03-12 MED ORDER — METHOCARBAMOL 100 MG/ML IJ SOLN: 1000.0000 mg | Freq: Once | INTRAMUSCULAR | Status: DC

## 2012-03-12 MED ORDER — SODIUM CHLORIDE 0.9 % IV SOLN
INTRAVENOUS | Status: DC
  Administered 2012-03-12: 17:00:00 via INTRAVENOUS

## 2012-03-12 MED ORDER — DIAZEPAM 5 MG PO TABS: 10.0000 mg | ORAL_TABLET | Freq: Two times a day (BID) | ORAL | Status: AC

## 2012-03-12 MED ORDER — OXYCODONE-ACETAMINOPHEN 10-325 MG PO TABS: 1.0000 | ORAL_TABLET | ORAL | Status: AC | PRN

## 2012-03-12 NOTE — ED Notes (Signed)
Reports severe R lower back pain and bil hip pain--lower back pain started months ago, and then 2 weeks ago started to have bil hip pain; reports that he had CT scan done today to verify no kidney stone--reports was negative and urine was negative; reports that Dr Venetia Maxon viewed the CT scan--and stated that Dr Venetia Maxon reports that he has a ruptured disc; sent to ED for pain meds and possible MRI; denies loss in bowel function; ambulatory at triage

## 2012-03-12 NOTE — ED Provider Notes (Signed)
History   Scribed for [name], MD, the patient was seen in room TR05C/TR05C . This chart was scribed by Lewanda Rife.    CSN: [id number]  Arrival date & time 03/12/12  1516   First MD Initiated Contact with Patient 03/12/12 1540      Chief Complaint  Patient presents with  . Back Pain    (Consider location/radiation/quality/duration/timing/severity/associated sxs/prior treatment) The history is provided by the patient and the spouse.   Donald Hoover is a 46 y.o. male who presents to the Emergency Department complaining of severe right flank pain for the past 2 months. Pt reports passing a kidney stone 1 month ago. Pt reports pain is at it's worse today. Pt reports he had a CT urogram today to verify he did not have any kidney stones and Dr. Venetia Maxon reported a ruptured disc. Pt denies dysuria and difficulty with bowel movements. Pt reports bilateral hip pain with movement and weight bearing. Dr. Venetia Maxon recommended he come to the ED today for an MRI and pain medications. Pt reports he has a hx of a ruptured cervical disc.  Past Medical History  Diagnosis Date  . Hypertension   . Hypercholesteremia   . DVT (deep venous thrombosis)     left calf  . GERD (gastroesophageal reflux disease)   . Sleep apnea     Past Surgical History  Procedure Date  . Neck surgery   . Shoulder surgery   . Kidney stone removal   . Colonoscopy   . Esophagogastroduodenoscopy 12/23/2011    Procedure: ESOPHAGOGASTRODUODENOSCOPY (EGD);  Surgeon: Willis Modena, MD;  Location: [facility];  Service: Endoscopy;  Laterality: N/A;  . Balloon dilation 12/23/2011    Procedure: BALLOON DILATION;  Surgeon: Willis Modena, MD;  Location: [facility];  Service: Endoscopy;  Laterality: N/A;    Family History  Problem Relation Age of Onset  . Colon polyps Father     History  Substance Use Topics  . Smoking status: Former Games developer  . Smokeless tobacco: Not on file   Comment: quit 20 years ago (2013)    . Alcohol Use: 3.6 oz/week    6 Cans of beer per week     socially      Review of Systems  Constitutional: Negative.   HENT: Negative.   Respiratory: Negative.   Cardiovascular: Negative.   Gastrointestinal: Negative.   Musculoskeletal: Negative.   Skin: Negative.   Neurological: Negative.   Hematological: Negative.   Psychiatric/Behavioral: Negative.   All other systems reviewed and are negative.    Allergies  Review of patient's allergies indicates no known allergies.  Home Medications   Current Outpatient Rx  Name Route Sig Dispense Refill  . ASPIRIN EC 81 MG PO TBEC Oral Take 162 mg by mouth daily.    Marland Kitchen ESOMEPRAZOLE MAGNESIUM 40 MG PO CPDR Oral Take 1 capsule (40 mg total) by mouth 2 (two) times daily.    Marland Kitchen EZETIMIBE 10 MG PO TABS Oral Take 10 mg by mouth daily.    . OMEGA-3 FATTY ACIDS 1000 MG PO CAPS Oral Take 2 g by mouth 2 (two) times daily.     Marland Kitchen GLUCOSAMINE PO Oral Take 2 tablets by mouth daily.    . MULTI-VITAMIN/MINERALS PO TABS Oral Take 1 tablet by mouth daily.    Marland Kitchen OLMESARTAN MEDOXOMIL 20 MG PO TABS Oral Take 20 mg by mouth daily.    Marland Kitchen PRAVASTATIN SODIUM 40 MG PO TABS Oral Take 40 mg by mouth daily.  BP 136/89  Pulse 83  Temp 97.9 F (36.6 C) (Oral)  Resp 18  SpO2 94%  Physical Exam  Nursing note and vitals reviewed. Constitutional: He is oriented to person, place, and time. He appears well-developed and well-nourished.  Non-toxic appearance. No distress.  HENT:  Head: Normocephalic and atraumatic.  Eyes: Conjunctivae, EOM and lids are normal. Pupils are equal, round, and reactive to light.  Neck: Normal range of motion. Neck supple. No tracheal deviation present. No mass present.  Cardiovascular: Normal rate, regular rhythm and normal heart sounds.  Exam reveals no gallop.   No murmur heard. Pulmonary/Chest: Effort normal and breath sounds normal. No stridor. No respiratory distress. He has no decreased breath sounds. He has no wheezes. He  has no rhonchi. He has no rales.  Abdominal: Soft. Normal appearance and bowel sounds are normal. He exhibits no distension. There is no tenderness. There is no rebound and no CVA tenderness.  Musculoskeletal: Normal range of motion. He exhibits no edema and no tenderness.       No CVA tenderness  Neurological: He is alert and oriented to person, place, and time. He has normal strength. No cranial nerve deficit or sensory deficit. GCS eye subscore is 4. GCS verbal subscore is 5. GCS motor subscore is 6.  Skin: Skin is warm and dry. No abrasion and no rash noted.  Psychiatric: He has a normal mood and affect. His speech is normal and behavior is normal.    ED Course  Procedures (including critical care time)  Labs Reviewed - No data to display No results found.   No diagnosis found.    MDM  Patient given pain medications and will have MRI of the back. Will be sent to the [facility]      I personally performed the services described in this documentation, which was scribed in my presence. The recorded information has been reviewed and considered.    [name], MD 03/12/12 [phone]

## 2012-03-12 NOTE — ED Provider Notes (Signed)
5:15 pm.:  Patient reports Dilaudid did not offer any relief. Additional medications ordered, including Valium and more Dilaudid. Awaiting MRI.  6:30 - patient returning from MRI, reports pain is improved.  6:50 - MRI showing mild bulging discs only. Pain returning. Discussed discharge home which patient and spouse are comfortable with. He has been ambulatory without difficulty. He will follow up with Dr. Venetia Maxon.  Rodena Medin, PA-C 03/12/12 [phone]

## 2012-03-17 NOTE — ED Provider Notes (Signed)
Medical screening examination/treatment/procedure(s) were performed by non-physician practitioner and as supervising physician I was immediately available for consultation/collaboration.  Marquetta Weiskopf T Brenna Friesenhahn, MD 03/17/12 0754 

## 2012-10-11 ENCOUNTER — Ambulatory Visit (INDEPENDENT_AMBULATORY_CARE_PROVIDER_SITE_OTHER): Payer: 59 | Admitting: Emergency Medicine

## 2012-10-11 VITALS — BP 138/80 | HR 92 | Temp 99.0°F | Resp 18 | Ht 70.5 in | Wt 233.0 lb

## 2012-10-11 DIAGNOSIS — S0512XA Contusion of eyeball and orbital tissues, left eye, initial encounter: Secondary | ICD-10-CM

## 2012-10-11 DIAGNOSIS — S0510XA Contusion of eyeball and orbital tissues, unspecified eye, initial encounter: Secondary | ICD-10-CM

## 2012-10-11 MED ORDER — HYDROCODONE-ACETAMINOPHEN 5-325 MG PO TABS: 1.0000 | ORAL_TABLET | ORAL | Status: DC | PRN

## 2012-10-11 MED ORDER — OFLOXACIN 0.3 % OP SOLN: 1.0000 [drp] | OPHTHALMIC | Status: DC

## 2012-10-11 NOTE — Patient Instructions (Addendum)
Eye Contusion  An eye contusion is a deep bruise of the eye. This is often called a "black eye." Contusions are the result of an injury that caused bleeding under the skin. The contusion may turn blue, purple, or yellow. Minor injuries will give you a painless contusion, but more severe contusions may stay painful and swollen for a few weeks. If the eye contusion only involves the eyelids and tissues around the eye, the injured area will get better within a few days to weeks. However, eye contusions can be serious and affect the eyeball and sight.  CAUSES    Blunt injury or trauma to the face or eye area.   A forehead injury that causes the blood under the skin to work its way down to the eyelids.   Rubbing the eyes due to irritation.  SYMPTOMS    Swelling and redness around the eye.   Bruising around the eye.   Tenderness, soreness, or pain around the eye.   Blurry vision.   Tearing.   Eyeball redness.  DIAGNOSIS   A diagnosis is usually based on a thorough exam of the eye and surrounding area. The eye must be looked at carefully to make sure it is not injured and to make sure nothing else will threaten your vision. A vision test may be done. An X-ray or computed tomography (CT) scan may be needed to determine if there are any associated injuries, such as broken bones (fractures).  TREATMENT   If there is an injury to the eye, treatment will be determined by the nature of the injury.  HOME CARE INSTRUCTIONS    Put ice on the injured area.   Put ice in a plastic bag.   Place a towel between your skin and the bag.   Leave the ice on for 15 to 20 minutes, 3 to 4 times a day.   If it is determined that there is no injury to the eye, you may continue normal activities.   Sunglasses may be worn to protect your eyes from bright light if light is uncomfortable.   Sleep with your head elevated. You can put an extra pillow under your head. This may help with discomfort.   Only take over-the-counter or  prescription medicines for pain, discomfort, or fever as directed by your caregiver. Do not take aspirin for the first few days. This may increase bruising.  SEEK IMMEDIATE MEDICAL CARE IF:    You have any form of vision loss.   You have double vision.   You feel nauseous.   You feel dizzy, sleepy, or like you will faint.   You have any fluid discharge from the eye or your nose.   You have swelling and discoloration that does not fade.  MAKE SURE YOU:    Understand these instructions.   Will watch your condition.   Will get help right away if you are not doing well or get worse.  Document Released: 06/21/2000 Document Revised: 09/16/2011 Document Reviewed: 05/10/2011  ExitCare Patient Information 2013 [employer].

## 2012-10-11 NOTE — Progress Notes (Signed)
[facility] [address], [place] [address] [phone]  Date:  10/11/2012   Name:  Donald Hoover   DOB:  1965-12-04   MRN:  [id number]  PCP:  Garlan Fillers, MD    Chief Complaint: Eye Injury   History of Present Illness:  Donald Hoover is a 47 y.o. very pleasant male patient who presents with the following:  Injured today when a limb he was clearing struck him in the left eye.  Has pain and photophobia.  Vision intact by confrontation.  Denies foreign body sensation.  No improvement with over the counter medications or other home remedies. Denies other complaint or health concern today.   There is no problem list on file for this patient.   Past Medical History  Diagnosis Date  . Hypertension   . Hypercholesteremia   . DVT (deep venous thrombosis)     left calf  . GERD (gastroesophageal reflux disease)   . Sleep apnea     Past Surgical History  Procedure Laterality Date  . Neck surgery    . Shoulder surgery    . Kidney stone removal    . Colonoscopy    . Esophagogastroduodenoscopy  12/23/2011    Procedure: ESOPHAGOGASTRODUODENOSCOPY (EGD);  Surgeon: Willis Modena, MD;  Location: [facility];  Service: Endoscopy;  Laterality: N/A;  . Balloon dilation  12/23/2011    Procedure: BALLOON DILATION;  Surgeon: Willis Modena, MD;  Location: [facility];  Service: Endoscopy;  Laterality: N/A;    History  Substance Use Topics  . Smoking status: Former Games developer  . Smokeless tobacco: Not on file     Comment: quit 20 years ago (2013)  . Alcohol Use: 3.6 oz/week    6 Cans of beer per week     Comment: socially    Family History  Problem Relation Age of Onset  . Colon polyps Father     No Known Allergies  Medication list has been reviewed and updated.  Current Outpatient Prescriptions on File Prior to Visit  Medication Sig Dispense Refill  . aspirin EC 81 MG tablet Take 162 mg by mouth daily.      Marland Kitchen esomeprazole (NEXIUM) 40 MG  capsule Take 1 capsule (40 mg total) by mouth 2 (two) times daily.      Marland Kitchen ezetimibe (ZETIA) 10 MG tablet Take 10 mg by mouth daily.      . fish oil-omega-3 fatty acids 1000 MG capsule Take 2 g by mouth 2 (two) times daily.       Marland Kitchen GLUCOSAMINE PO Take 2 tablets by mouth daily.      . Multiple Vitamins-Minerals (MULTIVITAMIN WITH MINERALS) tablet Take 1 tablet by mouth daily.      Marland Kitchen olmesartan (BENICAR) 20 MG tablet Take 20 mg by mouth daily.      . pravastatin (PRAVACHOL) 40 MG tablet Take 40 mg by mouth daily.       No current facility-administered medications on file prior to visit.    Review of Systems:  As per HPI, otherwise negative.    Physical Examination: Filed Vitals:   10/11/12 1507  BP: 138/80  Pulse: 92  Temp: 99 F (37.2 C)  Resp: 18   Filed Vitals:   10/11/12 1507  Height: 5' 10.5" (1.791 m)  Weight: 233 lb (105.688 kg)   Body mass index is 32.95 kg/(m^2). Ideal Body Weight: Weight in (lb) to have BMI = 25: 176.4   GEN: WDWN, NAD, Non-toxic, Alert &  Oriented x 3 HEENT: Atraumatic, Normocephalic.  No fluorescein uptake.  No visible foreign body.  PRRERLA EOMI Ears and Nose: No external deformity. EXTR: No clubbing/cyanosis/edema NEURO: Normal gait.  PSYCH: Normally interactive. Conversant. Not depressed or anxious appearing.  Calm demeanor.    Assessment and Plan: Contusion eye [facility] follow up tomorrow vicodin   Signed,  Phillips Odor, MD

## 2012-10-12 NOTE — Progress Notes (Signed)
Reviewed and agree.

## 2014-09-07 ENCOUNTER — Observation Stay (HOSPITAL_COMMUNITY)
Admission: EM | Admit: 2014-09-07 | Discharge: 2014-09-08 | Disposition: A | Discharge status: Home / Self Care | Payer: No Typology Code available for payment source | Attending: Cardiology | Admitting: Cardiology

## 2014-09-07 ENCOUNTER — Encounter (HOSPITAL_COMMUNITY): Payer: Self-pay | Admitting: *Deleted

## 2014-09-07 ENCOUNTER — Emergency Department (HOSPITAL_COMMUNITY): Payer: No Typology Code available for payment source

## 2014-09-07 DIAGNOSIS — F4321 Adjustment disorder with depressed mood: Secondary | ICD-10-CM

## 2014-09-07 DIAGNOSIS — M79601 Pain in right arm: Secondary | ICD-10-CM | POA: Insufficient documentation

## 2014-09-07 DIAGNOSIS — I10 Essential (primary) hypertension: Secondary | ICD-10-CM | POA: Diagnosis not present

## 2014-09-07 DIAGNOSIS — R072 Precordial pain: Principal | ICD-10-CM | POA: Insufficient documentation

## 2014-09-07 DIAGNOSIS — R200 Anesthesia of skin: Secondary | ICD-10-CM | POA: Diagnosis not present

## 2014-09-07 DIAGNOSIS — Z79899 Other long term (current) drug therapy: Secondary | ICD-10-CM | POA: Diagnosis not present

## 2014-09-07 DIAGNOSIS — G4730 Sleep apnea, unspecified: Secondary | ICD-10-CM | POA: Insufficient documentation

## 2014-09-07 DIAGNOSIS — Z86718 Personal history of other venous thrombosis and embolism: Secondary | ICD-10-CM | POA: Diagnosis not present

## 2014-09-07 DIAGNOSIS — K219 Gastro-esophageal reflux disease without esophagitis: Secondary | ICD-10-CM | POA: Diagnosis not present

## 2014-09-07 DIAGNOSIS — Z7982 Long term (current) use of aspirin: Secondary | ICD-10-CM | POA: Diagnosis not present

## 2014-09-07 DIAGNOSIS — R0789 Other chest pain: Secondary | ICD-10-CM

## 2014-09-07 DIAGNOSIS — Z87891 Personal history of nicotine dependence: Secondary | ICD-10-CM | POA: Insufficient documentation

## 2014-09-07 DIAGNOSIS — R079 Chest pain, unspecified: Secondary | ICD-10-CM | POA: Diagnosis present

## 2014-09-07 DIAGNOSIS — R202 Paresthesia of skin: Secondary | ICD-10-CM | POA: Insufficient documentation

## 2014-09-07 DIAGNOSIS — Z634 Disappearance and death of family member: Secondary | ICD-10-CM | POA: Diagnosis not present

## 2014-09-07 DIAGNOSIS — E780 Pure hypercholesterolemia: Secondary | ICD-10-CM | POA: Insufficient documentation

## 2014-09-07 HISTORY — DX: Dependence on other enabling machines and devices: Z99.89

## 2014-09-07 HISTORY — DX: Obstructive sleep apnea (adult) (pediatric): G47.33

## 2014-09-07 LAB — BASIC METABOLIC PANEL
Anion gap: 8 (ref 5–15)
BUN: 14 mg/dL (ref 6–23)
CO2: 22 mmol/L (ref 19–32)
Calcium: 8.9 mg/dL (ref 8.4–10.5)
Chloride: 109 mmol/L (ref 96–112)
Creatinine, Ser: 1.15 mg/dL (ref 0.50–1.35)
GFR calc Af Amer: 85 mL/min — ABNORMAL LOW (ref >90)
GFR calc non Af Amer: 74 mL/min — ABNORMAL LOW (ref >90)
Glucose, Bld: 128 mg/dL — ABNORMAL HIGH (ref 70–99)
Potassium: 3.6 mmol/L (ref 3.5–5.1)
Sodium: 139 mmol/L (ref 135–145)

## 2014-09-07 LAB — I-STAT TROPONIN, ED
Troponin i, poc: 0 ng/mL (ref 0.00–0.08)
Troponin i, poc: 0 ng/mL (ref 0.00–0.08)

## 2014-09-07 LAB — CBC
HCT: 38.8 % — ABNORMAL LOW (ref 39.0–52.0)
Hemoglobin: 13.1 g/dL (ref 13.0–17.0)
MCH: 29.2 pg (ref 26.0–34.0)
MCHC: 33.8 g/dL (ref 30.0–36.0)
MCV: 86.6 fL (ref 78.0–100.0)
Platelets: 193 10*3/uL (ref 150–400)
RBC: 4.48 MIL/uL (ref 4.22–5.81)
RDW: 13.4 % (ref 11.5–15.5)
WBC: 6.2 10*3/uL (ref 4.0–10.5)

## 2014-09-07 LAB — APTT: aPTT: 27 seconds (ref 24–37)

## 2014-09-07 LAB — PROTIME-INR
INR: 1.03 (ref 0.00–1.49)
Prothrombin Time: 13.6 seconds (ref 11.6–15.2)

## 2014-09-07 LAB — TROPONIN I: Troponin I: <0.03 ng/mL (ref <0.031)

## 2014-09-07 MED ORDER — PANTOPRAZOLE SODIUM 40 MG PO TBEC
40.0000 mg | DELAYED_RELEASE_TABLET | Freq: Every day | ORAL | Status: DC
  Administered 2014-09-08: 40 mg via ORAL
  Filled 2014-09-07: qty 1

## 2014-09-07 MED ORDER — ONDANSETRON HCL 4 MG/2ML IJ SOLN: 4.0000 mg | Freq: Four times a day (QID) | INTRAMUSCULAR | Status: DC | PRN

## 2014-09-07 MED ORDER — IRBESARTAN 150 MG PO TABS
150.0000 mg | ORAL_TABLET | Freq: Every day | ORAL | Status: DC
  Administered 2014-09-08: 150 mg via ORAL
  Filled 2014-09-07: qty 1

## 2014-09-07 MED ORDER — NITROGLYCERIN 2 % TD OINT
1.0000 [in_us] | TOPICAL_OINTMENT | Freq: Once | TRANSDERMAL | Status: AC
  Administered 2014-09-07: 1 [in_us] via TOPICAL
  Filled 2014-09-07: qty 1

## 2014-09-07 MED ORDER — ASPIRIN EC 81 MG PO TBEC
162.0000 mg | DELAYED_RELEASE_TABLET | Freq: Every day | ORAL | Status: DC
  Administered 2014-09-08: 162 mg via ORAL
  Filled 2014-09-07: qty 2

## 2014-09-07 MED ORDER — EZETIMIBE 10 MG PO TABS
10.0000 mg | ORAL_TABLET | Freq: Every day | ORAL | Status: DC
  Administered 2014-09-08: 10 mg via ORAL
  Filled 2014-09-07 (×2): qty 1

## 2014-09-07 MED ORDER — SODIUM CHLORIDE 0.9 % IV SOLN
1000.0000 mL | INTRAVENOUS | Status: DC
  Administered 2014-09-07: 1000 mL via INTRAVENOUS

## 2014-09-07 MED ORDER — ASPIRIN 300 MG RE SUPP
300.0000 mg | RECTAL | Status: AC
  Filled 2014-09-07: qty 1

## 2014-09-07 MED ORDER — ASPIRIN 81 MG PO CHEW
162.0000 mg | CHEWABLE_TABLET | Freq: Once | ORAL | Status: AC
  Administered 2014-09-07: 162 mg via ORAL
  Filled 2014-09-07: qty 2

## 2014-09-07 MED ORDER — ACETAMINOPHEN 325 MG PO TABS: 650.0000 mg | ORAL_TABLET | ORAL | Status: DC | PRN

## 2014-09-07 MED ORDER — NITROGLYCERIN 0.4 MG SL SUBL
0.4000 mg | SUBLINGUAL_TABLET | SUBLINGUAL | Status: AC | PRN
  Administered 2014-09-07 (×3): 0.4 mg via SUBLINGUAL
  Filled 2014-09-07: qty 1

## 2014-09-07 MED ORDER — NITROGLYCERIN 0.4 MG SL SUBL
0.4000 mg | SUBLINGUAL_TABLET | SUBLINGUAL | Status: DC | PRN
Start: 2014-09-07 — End: 2014-09-08

## 2014-09-07 MED ORDER — ACETAMINOPHEN 325 MG PO TABS
650.0000 mg | ORAL_TABLET | Freq: Once | ORAL | Status: AC
  Administered 2014-09-07: 650 mg via ORAL
  Filled 2014-09-07: qty 2

## 2014-09-07 MED ORDER — HEPARIN SODIUM (PORCINE) 5000 UNIT/ML IJ SOLN
5000.0000 [IU] | Freq: Three times a day (TID) | INTRAMUSCULAR | Status: DC
  Administered 2014-09-08 (×2): 5000 [IU] via SUBCUTANEOUS
  Filled 2014-09-07 (×5): qty 1

## 2014-09-07 MED ORDER — ASPIRIN 81 MG PO CHEW: 324.0000 mg | CHEWABLE_TABLET | Freq: Once | ORAL | Status: DC

## 2014-09-07 MED ORDER — ASPIRIN 81 MG PO CHEW
324.0000 mg | CHEWABLE_TABLET | ORAL | Status: AC
  Administered 2014-09-08: 324 mg via ORAL
  Filled 2014-09-07: qty 4

## 2014-09-07 MED ORDER — PRAVASTATIN SODIUM 40 MG PO TABS
40.0000 mg | ORAL_TABLET | Freq: Every day | ORAL | Status: DC
  Administered 2014-09-08: 40 mg via ORAL
  Filled 2014-09-07 (×2): qty 1

## 2014-09-07 NOTE — ED Notes (Signed)
Pt states hes been under a lot of stress, lost his father Friday night. Pt also states found out Monday his best friend died as well. Pt states CP started Monday, went to be seen at a different ER. Was seen there for CP, given 2 nitro and kept in ER for observation and did serial troponins x4. Pt was sent home from ER this morning. Again developed chest pain this morning at 11am again. Pt is in NAD, AAOX4. Pain is radiating to right arm and jaw with numbness and tingling sensation. Denies sweating, N/V/D, LOC, SOB. Pain is 5-6/10.

## 2014-09-07 NOTE — H&P (Signed)
Patient ID: Donald Hoover MRN: [id number], DOB/AGE: 49-Jun-1967   Admit date: 09/07/2014   Primary Physician: Garlan Fillers, MD Primary Cardiologist: New (scheduled for new patient appointment with Dr. [place] Lions 09/09/2014)  Pt. Profile:  49 year old male with history of treated hypertension and hyperlipidemia presenting to the ED with complaints of chest pain  Problem List  Past Medical History  Diagnosis Date  . Hypertension   . Hypercholesteremia   . DVT (deep venous thrombosis)     left calf  . GERD (gastroesophageal reflux disease)   . Sleep apnea     Past Surgical History  Procedure Laterality Date  . Neck surgery    . Shoulder surgery    . Kidney stone removal    . Colonoscopy    . Esophagogastroduodenoscopy  12/23/2011    Procedure: ESOPHAGOGASTRODUODENOSCOPY (EGD);  Surgeon: Willis Modena, MD;  Location: [facility];  Service: Endoscopy;  Laterality: N/A;  . Balloon dilation  12/23/2011    Procedure: BALLOON DILATION;  Surgeon: Willis Modena, MD;  Location: [facility];  Service: Endoscopy;  Laterality: N/A;     Allergies  No Known Allergies  HPI The patient is a 49 year old male followed by Dr. Ivery Quale with a history of treated hypertension and hyperlipidemia. No personal history of cardiac disease. Family history is also negative for cardiac disease however his father had significant peripheral vascular disease. He denies history of tobacco abuse and no history of diabetes. He has a prior history of lower extremity DVT after suffering a traumatic leg injury. This was treated with Coumadin but this has since been discontinued. His wife is a Publishing rights manager at [employer] urology.    He presents to the [facility] ED today with chest pain. He admits to being under significant amount of stress over the last several months and especially over the last several days. His father died 5 days ago from cancer. He he also recently lost his close friend/employee who  suffered a massive MI 2 days ago. Over the last 5 days he has experienced recurrent, intermittent, substernal chest pain. The first episode occurred Monday while he was in [place] for his father's funeral. Symptoms were described as substernal chest tightness/pressure occurring at rest. He denies any other associated symptoms including no dyspnea, nausea, vomiting, diaphoresis, dizziness, syncope/near-syncope. The pain was severe, 7 out of 10. He went to a local ED in [place] for evaluation. There his pain was relieved with sublingual nitroglycerin. He was monitored and cardiac enzymes were negative 4. He was instructed to follow-up with a cardiologist here in [place]. He called the office and arranged a new patient appointment with Dr. Antoine Poche on 09/09/2014. However, he developed recurrent chest discomfort yesterday and today, this time radiating to his right upper extremity with associated numbness and tingling. No exacerbating factors. He presented to the [facility] [place] and was given sublingual nitroglycerin as well as a nitroglycerin patch. This completely relieved his symptoms. He is now chest pain-free. Initial troponin is negative. His EKG demonstrates normal sinus rhythm. Blood pressure is well controlled. Heart rate is stable in the 60s. CXR and labs unremarkable.   Home Medications  Prior to Admission medications   Medication Sig Start Date End Date Taking? Authorizing Provider  aspirin EC 81 MG tablet Take 162 mg by mouth daily.   Yes Historical Provider, MD  ezetimibe (ZETIA) 10 MG tablet Take 10 mg by mouth at bedtime.    Yes Historical Provider, MD  fish oil-omega-3 fatty acids 1000 MG capsule  Take 2 g by mouth 2 (two) times daily.    Yes Historical Provider, MD  GLUCOSAMINE PO Take 2 tablets by mouth daily.   Yes Historical Provider, MD  Multiple Vitamins-Minerals (MULTIVITAMIN WITH MINERALS) tablet Take 1 tablet by mouth daily.   Yes Historical Provider, MD  olmesartan (BENICAR) 20 MG tablet  Take 20 mg by mouth at bedtime.    Yes Historical Provider, MD  pravastatin (PRAVACHOL) 40 MG tablet Take 40 mg by mouth at bedtime.    Yes Historical Provider, MD  esomeprazole (NEXIUM) 40 MG capsule Take 1 capsule (40 mg total) by mouth 2 (two) times daily. Patient taking differently: Take 40 mg by mouth 2 (two) times daily as needed.  12/23/11 12/22/12  Willis Modena, MD  HYDROcodone-acetaminophen (NORCO) 5-325 MG per tablet Take 1 tablet by mouth every 4 (four) hours as needed. 10/11/12   Carmelina Dane, MD  ofloxacin (OCUFLOX) 0.3 % ophthalmic solution Place 1 drop into the left eye every 4 (four) hours. Patient not taking: Reported on 09/07/2014 10/11/12   Carmelina Dane, MD    Family History  Family History  Problem Relation Age of Onset  . Colon polyps Father     Social History  History   Social History  . Marital Status: Married    Spouse Name: N/A  . Number of Children: N/A  . Years of Education: N/A   Occupational History  . Not on file.   Social History Main Topics  . Smoking status: Former Games developer  . Smokeless tobacco: Not on file     Comment: quit 20 years ago (2013)  . Alcohol Use: 3.6 oz/week    6 Cans of beer per week     Comment: socially  . Drug Use: No  . Sexual Activity: Not on file   Other Topics Concern  . Not on file   Social History Narrative     Review of Systems General:  No chills, fever, night sweats or weight changes.  Cardiovascular:  No chest pain, dyspnea on exertion, edema, orthopnea, palpitations, paroxysmal nocturnal dyspnea. Dermatological: No rash, lesions/masses Respiratory: No cough, dyspnea Urologic: No hematuria, dysuria Abdominal:   No nausea, vomiting, diarrhea, bright red blood per rectum, melena, or hematemesis Neurologic:  No visual changes, wkns, changes in mental status. All other systems reviewed and are otherwise negative except as noted above.  Physical Exam  Blood pressure 116/71, pulse 63, temperature 98 F  (36.7 C), temperature source Oral, resp. rate 14, SpO2 99 %.  General: Pleasant, NAD Psych: Normal affect. Neuro: Alert and oriented X 3. Moves all extremities spontaneously. HEENT: Normal  Neck: Supple without bruits or JVD. Lungs:  Resp regular and unlabored, CTA. Heart: RRR no s3, s4, or murmurs. Abdomen: Soft, non-tender, non-distended, BS + x 4.  Extremities: No clubbing, cyanosis or edema. DP/PT/Radials 2+ and equal bilaterally.  Labs  Troponin [facility] Test)  Recent Labs  09/07/14 1324  TROPIPOC 0.00   No results for input(s): CKTOTAL, CKMB, TROPONINI in the last 72 hours. Lab Results  Component Value Date   WBC 6.2 09/07/2014   HGB 13.1 09/07/2014   HCT 38.8* 09/07/2014   MCV 86.6 09/07/2014   PLT 193 09/07/2014    Recent Labs Lab 09/07/14 1315  NA 139  K 3.6  CL 109  CO2 22  BUN 14  CREATININE 1.15  CALCIUM 8.9  GLUCOSE 128*   No results found for: CHOL, HDL, LDLCALC, TRIG No results found for: DDIMER  Radiology/Studies  Dg Chest 2 View  09/07/2014   CLINICAL DATA:  Chest pain for 1 week  EXAM: CHEST  2 VIEW  COMPARISON:  12/09/2009  FINDINGS: Cardiomediastinal silhouette is stable. No acute infiltrate or pleural effusion. No pulmonary edema. Again noted metallic fixation plate cervical spine.  IMPRESSION: No active cardiopulmonary disease.   Electronically Signed   By: Natasha MeadLiviu  Pop M.D.   On: 09/07/2014 14:45    ECG NSR 78 bpm. No ischemic abnormalities    ASSESSMENT AND PLAN  1. Chest pain: Cardiac risk factors include hypertension and hyperlipidemia. He has had recurrent intermittent substernal chest discomfort, described as pressure/tightness, occurring at rest and radiating to the right upper extremity. Symptoms have occurred during times of emotional stress. Symptoms also relieved with nitroglycerin. Recommend admission for observation/further evaluation. Initial troponin is negative. Continue to cycle 3. We discussed performing an  ischemic eval in the a.m. Make NPO at midnight and plan for exercise NST tomorrow. Continue aspirin, statin and ARB. His blood pressure and heart rate will not tolerate initiation of beta blocker at this time.  2. HTN: Well controlled. Continue home meds  3. HLD: continue statin. Check fasting lipid in the am.   Signed, Robbie LisSIMMONS, Antoino Westhoff, PA-C 09/07/2014, 3:59 PM

## 2014-09-07 NOTE — ED Notes (Signed)
Pt states that he has substernal chest pain that radiates to rt arm and jaw. Pt has recently been seen for chest pain in [place] a day ago. Pt states that this pain is different this time.

## 2014-09-07 NOTE — ED Provider Notes (Signed)
CSN: [id number]     Arrival date & time 09/07/14  1222 History   First MD Initiated Contact with Patient 09/07/14 1248     Chief Complaint  Patient presents with  . Chest Pain    HPI Patient has been dealing with a lot of stress recently. His father passed away on Friday night. He found out this past Monday that his best friend died as well. The patient was hospitalized about 2 days ago because of complaints of substernal chest pain. During that evaluation he was given 2 nitroglycerin and the symptoms resolved. The patient was admitted overnight for 23 hour observation. He had serial cardiac enzymes were all normal. The patient was discharged from the facility the following morning. Less than 24 hours later patient develops chest pain again this morning at about 11 AM. Since that time he's had constant pressure in the center of his chest radiating to his right arm he also has a tingling sensation. He denies any sweating. No nausea vomiting diarrhea. No shortness of breath. No LOC. Pain is moderate in severity. He spoke with his doctor who suggested he come to the emergency room. He was awaiting a referral to a cardiologist. They instructed him to see a cardiologist here.   Past Medical History  Diagnosis Date  . Hypertension   . Hypercholesteremia   . DVT (deep venous thrombosis)     left calf  . GERD (gastroesophageal reflux disease)   . Sleep apnea    Past Surgical History  Procedure Laterality Date  . Neck surgery    . Shoulder surgery    . Kidney stone removal    . Colonoscopy    . Esophagogastroduodenoscopy  12/23/2011    Procedure: ESOPHAGOGASTRODUODENOSCOPY (EGD);  Surgeon: Willis ModenaWilliam Outlaw, MD;  [facility];  Service: Endoscopy;  Laterality: N/A;  . Balloon dilation  12/23/2011    Procedure: BALLOON DILATION;  Surgeon: Willis ModenaWilliam Outlaw, MD;  Location: [facility];  Service: Endoscopy;  Laterality: N/A;   Family History  Problem Relation Age of Onset  . Colon polyps Father     History  Substance Use Topics  . Smoking status: Former Games developermoker  . Smokeless tobacco: Not on file     Comment: quit 20 years ago (2013)  . Alcohol Use: 3.6 oz/week    6 Cans of beer per week     Comment: socially    Review of Systems  All other systems reviewed and are negative.     Allergies  Review of patient's allergies indicates no known allergies.  Home Medications   Prior to Admission medications   Medication Sig Start Date End Date Taking? Authorizing Provider  aspirin EC 81 MG tablet Take 162 mg by mouth daily.   Yes Historical Provider, MD  ezetimibe (ZETIA) 10 MG tablet Take 10 mg by mouth at bedtime.    Yes Historical Provider, MD  fish oil-omega-3 fatty acids 1000 MG capsule Take 2 g by mouth 2 (two) times daily.    Yes Historical Provider, MD  GLUCOSAMINE PO Take 2 tablets by mouth daily.   Yes Historical Provider, MD  Multiple Vitamins-Minerals (MULTIVITAMIN WITH MINERALS) tablet Take 1 tablet by mouth daily.   Yes Historical Provider, MD  olmesartan (BENICAR) 20 MG tablet Take 20 mg by mouth at bedtime.    Yes Historical Provider, MD  pravastatin (PRAVACHOL) 40 MG tablet Take 40 mg by mouth at bedtime.    Yes Historical Provider, MD  esomeprazole (NEXIUM) 40 MG capsule Take 1  capsule (40 mg total) by mouth 2 (two) times daily. Patient taking differently: Take 40 mg by mouth 2 (two) times daily as needed.  12/23/11 12/22/12  Willis Modena, MD  HYDROcodone-acetaminophen (NORCO) 5-325 MG per tablet Take 1 tablet by mouth every 4 (four) hours as needed. 10/11/12   Carmelina Dane, MD  ofloxacin (OCUFLOX) 0.3 % ophthalmic solution Place 1 drop into the left eye every 4 (four) hours. Patient not taking: Reported on 09/07/2014 10/11/12   Carmelina Dane, MD   BP 116/71 mmHg  Pulse 63  Temp(Src) 98 F (36.7 C) (Oral)  Resp 14  SpO2 99% Physical Exam  Constitutional: He appears well-developed and well-nourished. No distress.  HENT:  Head: Normocephalic and  atraumatic.  Right Ear: External ear normal.  Left Ear: External ear normal.  Eyes: Conjunctivae are normal. Right eye exhibits no discharge. Left eye exhibits no discharge. No scleral icterus.  Neck: Neck supple. No tracheal deviation present.  Cardiovascular: Normal rate, regular rhythm and intact distal pulses.   Pulmonary/Chest: Effort normal and breath sounds normal. No stridor. No respiratory distress. He has no wheezes. He has no rales.  Abdominal: Soft. Bowel sounds are normal. He exhibits no distension. There is no tenderness. There is no rebound and no guarding.  Musculoskeletal: He exhibits no edema or tenderness.  Neurological: He is alert. He has normal strength. No cranial nerve deficit (no facial droop, extraocular movements intact, no slurred speech) or sensory deficit. He exhibits normal muscle tone. He displays no seizure activity. Coordination normal.  Skin: Skin is warm and dry. No rash noted.  Psychiatric: He has a normal mood and affect.  Nursing note and vitals reviewed.   ED Course  Procedures (including critical care time) Labs Review Labs Reviewed  CBC - Abnormal; Notable for the following:    HCT 38.8 (*)    All other components within normal limits  BASIC METABOLIC PANEL - Abnormal; Notable for the following:    Glucose, Bld 128 (*)    GFR calc non Af Amer 74 (*)    GFR calc Af Amer 85 (*)    All other components within normal limits  APTT  PROTIME-INR  I-STAT TROPOININ, ED  I-STAT TROPOININ, ED  I-STAT TROPOININ, ED  Rosezena Sensor, ED    Imaging Review Dg Chest 2 View  09/07/2014   CLINICAL DATA:  Chest pain for 1 week  EXAM: CHEST  2 VIEW  COMPARISON:  12/09/2009  FINDINGS: Cardiomediastinal silhouette is stable. No acute infiltrate or pleural effusion. No pulmonary edema. Again noted metallic fixation plate cervical spine.  IMPRESSION: No active cardiopulmonary disease.   Electronically Signed   By: Natasha Mead M.D.   On: 09/07/2014 14:45      EKG Interpretation   Date/Time:  Wednesday September 07 2014 12:24:08 EST Ventricular Rate:  78 PR Interval:  154 QRS Duration: 94 QT Interval:  352 QTC Calculation: 401 R Axis:   116 Text Interpretation:  Normal sinus rhythm with sinus arrhythmia Right axis  deviation Abnormal ECG nonspecific st changes inferiorly since last  tracing Confirmed by Aleksandr Pellow  MD-J, Matej Sappenfield ([id number]) on 09/07/2014 12:39:02 PM      MDM   Final diagnoses:  Chest pain, unspecified chest pain type    Pt is having recurrent chest pain.  Recent 23 hour obs rule out at another facility.  No acute ischemia noted by EKG or labs.   Pt was told by PCP to come to the ED.  Will contact  cardiology. Would benefit from stress testing.    Linwood Dibbles, MD 09/07/14 [phone]

## 2014-09-08 ENCOUNTER — Observation Stay (HOSPITAL_COMMUNITY): Payer: No Typology Code available for payment source

## 2014-09-08 ENCOUNTER — Encounter (HOSPITAL_COMMUNITY): Payer: Self-pay | Admitting: General Practice

## 2014-09-08 DIAGNOSIS — R072 Precordial pain: Secondary | ICD-10-CM

## 2014-09-08 DIAGNOSIS — R079 Chest pain, unspecified: Secondary | ICD-10-CM | POA: Diagnosis not present

## 2014-09-08 LAB — BASIC METABOLIC PANEL
Anion gap: 7 (ref 5–15)
BUN: 14 mg/dL (ref 6–23)
CO2: 24 mmol/L (ref 19–32)
Calcium: 8.8 mg/dL (ref 8.4–10.5)
Chloride: 108 mmol/L (ref 96–112)
Creatinine, Ser: 1.11 mg/dL (ref 0.50–1.35)
GFR calc Af Amer: 89 mL/min — ABNORMAL LOW (ref >90)
GFR calc non Af Amer: 77 mL/min — ABNORMAL LOW (ref >90)
Glucose, Bld: 95 mg/dL (ref 70–99)
Potassium: 3.9 mmol/L (ref 3.5–5.1)
Sodium: 139 mmol/L (ref 135–145)

## 2014-09-08 LAB — LIPID PANEL
Cholesterol: 147 mg/dL (ref 0–200)
HDL: 29 mg/dL — ABNORMAL LOW (ref >39)
LDL Cholesterol: 53 mg/dL (ref 0–99)
Total CHOL/HDL Ratio: 5.1 RATIO
Triglycerides: 323 mg/dL — ABNORMAL HIGH (ref <150)
VLDL: 65 mg/dL — ABNORMAL HIGH (ref 0–40)

## 2014-09-08 LAB — TROPONIN I
Troponin I: <0.03 ng/mL (ref <0.031)
Troponin I: <0.03 ng/mL (ref <0.031)

## 2014-09-08 MED ORDER — TECHNETIUM TC 99M SESTAMIBI GENERIC - CARDIOLITE
10.0000 | Freq: Once | INTRAVENOUS | Status: AC | PRN
  Administered 2014-09-08: 10 via INTRAVENOUS

## 2014-09-08 MED ORDER — TECHNETIUM TC 99M SESTAMIBI GENERIC - CARDIOLITE
30.0000 | Freq: Once | INTRAVENOUS | Status: AC | PRN
  Administered 2014-09-08: 30 via INTRAVENOUS

## 2014-09-08 NOTE — Progress Notes (Signed)
    Subjective:  Denies CP or dyspnea   Objective:  Filed Vitals:   09/07/14 1800 09/07/14 1833 09/07/14 2143 09/08/14 0439  BP: 106/55 117/65 125/63 115/67  Pulse: 62 55 63 71  Temp:  98.2 F (36.8 C) 98 F (36.7 C) 97.7 F (36.5 C)  TempSrc:  Oral Oral Oral  Resp: 13 18 18 17   Height:  6' (1.829 m)    Weight:  226 lb 8 oz (102.74 kg)    SpO2: 97% 100% 98% 99%    Intake/Output from previous day:  Intake/Output Summary (Last 24 hours) at 09/08/14 0908 Last data filed at 09/07/14 2000  Gross per 24 hour  Intake    240 ml  Output      0 ml  Net    240 ml    Physical Exam: Physical exam: Well-developed well-nourished in no acute distress.  Skin is warm and dry.  HEENT is normal.  Neck is supple.  Chest is clear to auscultation with normal expansion.  Cardiovascular exam is regular rate and rhythm.  Abdominal exam nontender or distended. No masses palpated. Extremities show no edema. neuro grossly intact    Lab Results: Basic Metabolic Panel:  Recent Labs  16/04/9602/02/16 1315 09/08/14 0450  NA 139 139  K 3.6 3.9  CL 109 108  CO2 22 24  GLUCOSE 128* 95  BUN 14 14  CREATININE 1.15 1.11  CALCIUM 8.9 8.8   CBC:  Recent Labs  09/07/14 1315  WBC 6.2  HGB 13.1  HCT 38.8*  MCV 86.6  PLT 193   Cardiac Enzymes:  Recent Labs  09/07/14 2305 09/08/14 0450  TROPONINI <0.03 <0.03     Assessment/Plan:  1 chest pain-patient has ruled out. For nuclear study today for risk stratification. If normal patient can be discharged and follow-up with his primary care. Hypertension-continue preadmission medications at discharge. 3 hyperlipidemia-continue statin and zetia  Olga MillersBrian Shakenna Herrero 09/08/2014, 9:08 AM

## 2014-09-08 NOTE — Plan of Care (Signed)
Problem: Consults Goal: Chest Pain Patient Education (See Patient Education module for education specifics.) Patient has not complained of chest pain since 1900 at beginning of shift.  He will be kept NPO overnight for stress testing in am, and verbalized understanding of this.  Will continue to monitor and assist patient as he needs. Goal: Skin Care Protocol Initiated - if Braden Score 18 or less If consults are not indicated, leave blank or document N/A Skin is warm dry and intact. Goal: Tobacco Cessation referral if indicated Patient is not a current smoker. Goal: Nutrition Consult-if indicated Patient eats well, adequately nourished Goal: Diabetes Guidelines if Diabetic/Glucose > 140 If diabetic or lab glucose is > 140 mg/dl - Initiate Diabetes/Hyperglycemia Guidelines & Document Interventions  Pt is not diabetic

## 2014-09-08 NOTE — Discharge Summary (Signed)
Discharge Summary   Patient ID: Donald Hoover MRN: [id number], DOB/AGE: 49-Jun-1967 49 y.o. Admit date: 09/07/2014 D/C date:     09/08/2014  Primary Care Provider: Garlan Fillers, MD Primary Cardiologist: Dr. Anne Fu  Primary Discharge Diagnoses:  1. Chest pain, question grief reaction/stress - normal stress test, ruled out for MI 2. HTN 3. Hyperlipidemia 4. GERD  Secondary Discharge Diagnoses:  1. 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 Sleep apnea  Hospital Course: Mr. Balboa is a 49 y/o never-smoker male with a history of treated hypertension and hyperlipidemia but no prior cardiac history. Family history is negative for cardiac disease however his father had significant peripheral vascular disease. He has a prior history of lower extremity DVT after suffering a traumatic leg injury. This was treated with Coumadin but this has since been discontinued. His wife is a Publishing rights manager at [employer].   He presented to the ER yesterday with chest pain. He admitted to being under significant amount of stress over the last several months and especially over the last several days. His father had died several days ago from cancer. He he also recently lost his close friend/employee who suffered a massive MI. Over the last 5 days he has experienced recurrent, intermittent, substernal chest pain. The first episode occurred Monday 09/05/14 while he was in [place] for his father's funeral. Symptoms were described as 7/10 substernal chest tightness/pressure occurring at rest. He denies any other associated symptoms including no dyspnea, nausea, vomiting, diaphoresis, dizziness, syncope/near-syncope. He went to a local ED in [place] for evaluation. There his pain was relieved with sublingual nitroglycerin. He was monitored and cardiac enzymes were negative 4. He was instructed to follow-up with a cardiologist here in [place]. He arranged a new-patient appointment with Dr. Antoine Poche on 09/09/14,  however, developed recurrent chest discomfort prompting him to seek care in the ER yesterday (09/07/14). He was given sublingual nitroglycerin as well as a nitroglycerin patch. This completely relieved his symptoms. Initial troponin was negative. His EKG demonstrated NSR without acute changes. VSS and controlled. CXR and labs were unremarkable. He remained chest pain free and ruled out for MI. Stress test was normal - no reversible ischemia or infarction, normal LV motion, EF 60%. Labs did show trig 323, HDL 29, LDL 53. Dr. Jens Som recommended he follow-up with his primary care doctor. We have cancelled his new patient appointment with Dr. Antoine Poche since he was already evaluated in the hospital but I told the patient to please contact us if we can be of any more help. He was continued on his preadmission meds. Dr. Jens Som has seen and examined the patient today and feels he is stable for discharge.   Discharge Vitals: Blood pressure 134/84, pulse 73, temperature 97.6 F (36.4 C), temperature source Oral, resp. rate 20, height 6' (1.829 m), weight 226 lb 8 oz (102.74 kg), SpO2 99 %.  Labs: Lab Results  Component Value Date   WBC 6.2 09/07/2014   HGB 13.1 09/07/2014   HCT 38.8* 09/07/2014   MCV 86.6 09/07/2014   PLT 193 09/07/2014    Recent Labs Lab 09/08/14 0450  NA 139  K 3.9  CL 108  CO2 24  BUN 14  CREATININE 1.11  CALCIUM 8.8  GLUCOSE 95    Recent Labs  09/07/14 2305 09/08/14 0450 09/08/14 1315  TROPONINI <0.03 <0.03 <0.03   Lab Results  Component Value Date   CHOL 147 09/08/2014   HDL 29* 09/08/2014   LDLCALC 53  09/08/2014   TRIG 323* 09/08/2014   No results found for: DDIMER  Diagnostic Studies/Procedures   Dg Chest 2 View  09/07/2014   CLINICAL DATA:  Chest pain for 1 week  EXAM: CHEST  2 VIEW  COMPARISON:  12/09/2009  FINDINGS: Cardiomediastinal silhouette is stable. No acute infiltrate or pleural effusion. No pulmonary edema. Again noted metallic fixation plate  cervical spine.  IMPRESSION: No active cardiopulmonary disease.   Electronically Signed   By: Natasha MeadLiviu  Pop M.D.   On: 09/07/2014 14:45   Nm Myocar Multi W/spect W/wall Motion / Ef  09/08/2014   CLINICAL DATA:  49 year old with chest pain, unspecified chest pain type.  EXAM: MYOCARDIAL IMAGING WITH SPECT (REST AND EXERCISE)  GATED LEFT VENTRICULAR WALL MOTION STUDY  LEFT VENTRICULAR EJECTION FRACTION  TECHNIQUE: Standard myocardial SPECT imaging was performed after resting intravenous injection of 10 mCi [id number] sestamibi. Subsequently, exercise tolerance test was performed by the patient under the supervision of the Cardiology staff. At peak-stress, 30 mCi Tc-4665m sestamibi was injected intravenously and standard myocardial SPECT imaging was performed. Quantitative gated imaging was also performed to evaluate left ventricular wall motion, and estimate left ventricular ejection fraction.  COMPARISON:  None.  FINDINGS: Perfusion: No decreased activity in the left ventricle on stress imaging to suggest reversible ischemia or infarction.  Wall Motion: Normal left ventricular wall motion. No left ventricular dilation.  Left Ventricular Ejection Fraction: 60 %  End diastolic volume 109 ml  End systolic volume 44 ml  IMPRESSION: 1. No reversible ischemia or infarction.  2. Normal left ventricular wall motion.  3. Left ventricular ejection fraction is 60%.  4. Low risk stress test findings*.  *2012 Appropriate Use Criteria for Coronary Revascularization Focused Update: J Am Coll Cardiol. 2012;59(9):857-881. http://content.dementiazones.comonlinejacc.org/article.aspx?articleid=1201161   Electronically Signed   By: Richarda OverlieAdam  Henn M.D.   On: 09/08/2014 11:36    Discharge Medications   Current Discharge Medication List    CONTINUE these medications which have NOT CHANGED   Details  aspirin EC 81 MG tablet Take 162 mg by mouth daily.    ezetimibe (ZETIA) 10 MG tablet Take 10 mg by mouth at bedtime.     fish oil-omega-3 fatty acids 1000 MG  capsule Take 2 g by mouth 2 (two) times daily.     GLUCOSAMINE PO Take 2 tablets by mouth daily.    Multiple Vitamins-Minerals (MULTIVITAMIN WITH MINERALS) tablet Take 1 tablet by mouth daily.    olmesartan (BENICAR) 20 MG tablet Take 20 mg by mouth at bedtime.     pravastatin (PRAVACHOL) 40 MG tablet Take 40 mg by mouth at bedtime.     esomeprazole (NEXIUM) 40 MG capsule Take 1 capsule (40 mg total) by mouth 2 (two) times daily.    HYDROcodone-acetaminophen (NORCO) 5-325 MG per tablet Take 1 tablet by mouth every 4 (four) hours as needed.       STOP taking these medications     ofloxacin (OCUFLOX) 0.3 % ophthalmic solution - completed course         Disposition   The patient will be discharged in stable condition to home. Discharge Instructions    Diet - low sodium heart healthy    Complete by:  As directed      Increase activity slowly    Complete by:  As directed           Follow-up Information    Follow up with Garlan FillersPATERSON,DANIEL G, MD.   Specialty:  Internal Medicine   Why:  Please  schedule a follow-up appointment as soon as possible with your primary care doctor.   Contact information:   [address] [place] [address] [phone]         Duration of Discharge Encounter: Greater than 30 minutes including physician and PA time.  Signed, Ronie Spies PA-C 09/08/2014, 3:58 PM

## 2014-09-08 NOTE — Progress Notes (Signed)
Pt has slept well over night, will continue to assist and monitor to meet pts needs

## 2014-09-08 NOTE — Progress Notes (Signed)
UR completed 

## 2014-09-09 ENCOUNTER — Ambulatory Visit: Payer: 59 | Admitting: Cardiovascular Disease

## 2014-09-09 ENCOUNTER — Ambulatory Visit: Payer: 59 | Admitting: Cardiology

## 2014-10-14 ENCOUNTER — Other Ambulatory Visit: Payer: Self-pay | Admitting: Internal Medicine

## 2014-10-14 DIAGNOSIS — M545 Low back pain: Secondary | ICD-10-CM

## 2014-12-13 ENCOUNTER — Ambulatory Visit: Payer: 59 | Admitting: Cardiovascular Disease

## 2015-06-28 ENCOUNTER — Other Ambulatory Visit: Payer: Self-pay | Admitting: Gastroenterology

## 2015-06-28 NOTE — Addendum Note (Signed)
Addended by: Willis ModenaUTLAW, Raihana Balderrama on: 06/28/2015 12:01 PM   Modules accepted: Orders

## 2015-07-04 ENCOUNTER — Encounter (HOSPITAL_COMMUNITY): Payer: Self-pay | Admitting: *Deleted

## 2015-07-04 NOTE — Progress Notes (Signed)
Pt denies cardiac history, chest pain or sob. He was worked up for chest pain in March, 2016 and had negative stress test and MI was ruled out, instructed to follow up with PCP. Pt states he's not had any chest pain since.  Pt does have sleep apnea and uses a CPAP - Sleep study in [facility]  EKG - 09/08/14 - in [facility] Stress - 09/08/14 - in [facility]

## 2015-07-05 ENCOUNTER — Encounter (HOSPITAL_COMMUNITY)
Admission: RE | Disposition: A | Discharge status: Home / Self Care | Payer: Self-pay | Source: Ambulatory Visit | Attending: Gastroenterology

## 2015-07-05 ENCOUNTER — Ambulatory Visit (HOSPITAL_COMMUNITY): Payer: No Typology Code available for payment source | Admitting: Anesthesiology

## 2015-07-05 ENCOUNTER — Encounter (HOSPITAL_COMMUNITY): Payer: Self-pay | Admitting: *Deleted

## 2015-07-05 ENCOUNTER — Ambulatory Visit (HOSPITAL_COMMUNITY)
Admission: RE | Admit: 2015-07-05 | Discharge: 2015-07-05 | Disposition: A | Discharge status: Home / Self Care | Payer: No Typology Code available for payment source | Source: Ambulatory Visit | Attending: Gastroenterology | Admitting: Gastroenterology

## 2015-07-05 DIAGNOSIS — K219 Gastro-esophageal reflux disease without esophagitis: Secondary | ICD-10-CM | POA: Diagnosis not present

## 2015-07-05 DIAGNOSIS — Z87891 Personal history of nicotine dependence: Secondary | ICD-10-CM | POA: Diagnosis not present

## 2015-07-05 DIAGNOSIS — Z86010 Personal history of colonic polyps: Secondary | ICD-10-CM | POA: Insufficient documentation

## 2015-07-05 DIAGNOSIS — G4730 Sleep apnea, unspecified: Secondary | ICD-10-CM | POA: Insufficient documentation

## 2015-07-05 DIAGNOSIS — Z8371 Family history of colonic polyps: Secondary | ICD-10-CM | POA: Diagnosis not present

## 2015-07-05 DIAGNOSIS — Z1211 Encounter for screening for malignant neoplasm of colon: Secondary | ICD-10-CM | POA: Insufficient documentation

## 2015-07-05 DIAGNOSIS — I10 Essential (primary) hypertension: Secondary | ICD-10-CM | POA: Diagnosis not present

## 2015-07-05 HISTORY — DX: Calculus of kidney: N20.0

## 2015-07-05 HISTORY — PX: COLONOSCOPY WITH PROPOFOL: SHX5780

## 2015-07-05 SURGERY — COLONOSCOPY WITH PROPOFOL
Anesthesia: Monitor Anesthesia Care

## 2015-07-05 MED ORDER — LACTATED RINGERS IV SOLN
INTRAVENOUS | Status: DC
  Administered 2015-07-05: 1000 mL via INTRAVENOUS

## 2015-07-05 MED ORDER — SODIUM CHLORIDE 0.9 % IV SOLN: INTRAVENOUS | Status: DC

## 2015-07-05 MED ORDER — PROPOFOL 10 MG/ML IV BOLUS
INTRAVENOUS | Status: DC | PRN
  Administered 2015-07-05: 10 mg via INTRAVENOUS
  Administered 2015-07-05: 20 mg via INTRAVENOUS
  Administered 2015-07-05 (×3): 10 mg via INTRAVENOUS

## 2015-07-05 MED ORDER — MIDAZOLAM HCL 5 MG/5ML IJ SOLN
INTRAMUSCULAR | Status: DC | PRN
  Administered 2015-07-05 (×2): 0.5 mg via INTRAVENOUS

## 2015-07-05 MED ORDER — PROPOFOL 500 MG/50ML IV EMUL
INTRAVENOUS | Status: DC | PRN
  Administered 2015-07-05: 50 ug/kg/min via INTRAVENOUS

## 2015-07-05 NOTE — Op Note (Signed)
[facility] [address] [place], [id number]   COLONOSCOPY PROCEDURE REPORT  PATIENT: Donald Hoover, [name]  MR#: [id number] BIRTHDATE: 1965/09/13 , 49  yrs. old GENDER: male ENDOSCOPIST: Donald ModenaWilliam Cephas Revard, MD REFERRED XL:KGMWNUBY:Donald Eloise HarmanPaterson, M.D. PROCEDURE DATE:  07/05/2015 PROCEDURE:   Colonoscopy, screening ASA CLASS:   Class II INDICATIONS:family history colonic polyps (father). MEDICATIONS: Monitored anesthesia care  DESCRIPTION OF PROCEDURE:   After the risks benefits and alternatives of the procedure were thoroughly explained, informed consent was obtained.  Digital rectal exam revealed no abnormalities of the rectum.   The adult colonoscope was introduced through the anus and advanced to the cecum, which was identified by both the appendix and ileocecal valve. No adverse events experienced.   The quality of the prep was good.  The instrument was then slowly withdrawn as the colon was fully examined. Estimated blood loss is zero unless otherwise noted in this procedure report.    Findings:  Digital rectal exam was normal.  Prep quality was good. Colon normal to level of cecum; no polyps, masses, vascular ectasias, or inflammatory changes were seen.  No diverticula evident.  Normal retroflexed view of rectum.           Withdrawal time was about 10 minutes     .  The scope was withdrawn and the procedure completed.  COMPLICATIONS: None immediate.  ENDOSCOPIC IMPRESSION:     Normal colonoscopy to cecum.  RECOMMENDATIONS:     1.  Watch for potential complications of procedure. 2.  Repeat colonoscopy in 5 years (FHx polyps). 3.  Follow-up with [facility] on as-needed basis.  eSigned:  Willis ModenaWilliam Sanav Remer, MD 07/05/2015 9:19 AM   cc:  CPT CODES: ICD CODES:  The ICD and CPT codes recommended by this software are interpretations from the data that the clinical staff has captured with the software.  The verification of the translation of this report to  the ICD and CPT codes and modifiers is the sole responsibility of the health care institution and practicing physician where this report was generated.  [employer] will not be held responsible for the validity of the ICD and CPT codes included on this report.  AMA assumes no liability for data contained or not contained herein. CPT is a Publishing rights managerregistered trademark of the [employer].

## 2015-07-05 NOTE — H&P (Signed)
Patient interval history reviewed.  Patient examined again.  There has been no change from documented H/P dated 06/28/15 (scanned into chart from our office) except as documented above.  Assessment:  1.  Personal history of colonic polyps.  Plan:  1.  Colonoscopy. 2.  Risks (bleeding, infection, bowel perforation that could require surgery, sedation-related changes in cardiopulmonary systems), benefits (identification and possible treatment of source of symptoms, exclusion of certain causes of symptoms), and alternatives (watchful waiting, radiographic imaging studies, empiric medical treatment) of colonoscopy were explained to patient/family in detail and patient wishes to proceed.

## 2015-07-05 NOTE — Anesthesia Postprocedure Evaluation (Signed)
Anesthesia Post Note  Patient: Loletta Parishdward D Bradstreet  Procedure(s) Performed: Procedure(s) (LRB): COLONOSCOPY WITH PROPOFOL (N/A)  Patient location during evaluation: PACU Anesthesia Type: MAC Level of consciousness: awake and alert Pain management: pain level controlled Vital Signs Assessment: post-procedure vital signs reviewed and stable Respiratory status: spontaneous breathing Cardiovascular status: blood pressure returned to baseline Anesthetic complications: no    Last Vitals:  Filed Vitals:   07/05/15 0935 07/05/15 0940  BP: 122/76   Pulse: 78 71  Temp:    Resp: 18 15    Last Pain: There were no vitals filed for this visit.               Kennieth RadFitzgerald, Meghen Akopyan E

## 2015-07-05 NOTE — Anesthesia Procedure Notes (Signed)
Procedure Name: MAC Date/Time: 07/05/2015 8:47 AM Performed by: Edmonia CaprioAUSTON, Nicholaus Steinke M Pre-anesthesia Checklist: Patient identified, Emergency Drugs available, Suction available, Patient being monitored and Timeout performed Patient Re-evaluated:Patient Re-evaluated prior to inductionOxygen Delivery Method: Simple face mask Preoxygenation: Pre-oxygenation with 100% oxygen Intubation Type: IV induction

## 2015-07-05 NOTE — Discharge Instructions (Signed)
Colonoscopy ° °Post procedure instructions: ° °Read the instructions outlined below and refer to this sheet in the next few weeks. These discharge instructions provide you with general information on caring for yourself after you leave the hospital. Your doctor may also give you specific instructions. While your treatment has been planned according to the most current medical practices available, unavoidable complications occasionally occur. If you have any problems or questions after discharge, call Dr. Hallis Meditz at [facility] ([phone]). ° °HOME CARE INSTRUCTIONS ° °ACTIVITY: °· You may resume your regular activity, but move at a slower pace for the next 24 hours.  °· Take frequent rest periods for the next 24 hours.  °· Walking will help get rid of the air and reduce the bloated feeling in your belly (abdomen).  °· No driving for 24 hours (because of the medicine (anesthesia) used during the test).  °· You may shower.  °· Do not sign any important legal documents or operate any machinery for 24 hours (because of the anesthesia used during the test).  °NUTRITION: °· Drink plenty of fluids.  °· You may resume your normal diet as instructed by your doctor.  °· Begin with a light meal and progress to your normal diet. Heavy or fried foods are harder to digest and may make you feel sick to your stomach (nauseated).  °· Avoid alcoholic beverages for 24 hours or as instructed.  °MEDICATIONS: °· You may resume your normal medications unless your doctor tells you otherwise.  °WHAT TO EXPECT TODAY: °· Some feelings of bloating in the abdomen.  °· Passage of more gas than usual.  °· Spotting of blood in your stool or on the toilet paper.  °IF YOU HAD POLYPS REMOVED DURING THE COLONOSCOPY: °· No aspirin products for 7 days or as instructed.  °· No alcohol for 7 days or as instructed.  °· Eat a soft diet for the next 24 hours.  ° °FINDING OUT THE RESULTS OF YOUR TEST ° °Not all test results are available during your  visit. If your test results are not back during the visit, make an appointment with your caregiver to find out the results. Do not assume everything is normal if you have not heard from your caregiver or the medical facility. It is important for you to follow up on all of your test results.  ° ° ° °SEEK IMMEDIATE MEDICAL CARE IF: ° °· You have more than a spotting of blood in your stool.  °· Your belly is swollen (abdominal distention).  °· You are nauseated or vomiting.  °· You have a fever.  °· You have abdominal pain or discomfort that is severe or gets worse throughout the day.  ° ° °Document Released: 02/06/2004 Document Revised: 03/06/2011 Document Reviewed: 02/04/2008 °ExitCare® Patient Information ©2012 [employer]. ° °

## 2015-07-05 NOTE — Anesthesia Preprocedure Evaluation (Addendum)
Anesthesia Evaluation  Patient identified by MRN, date of birth, ID band Patient awake    Reviewed: Allergy & Precautions, NPO status , Patient's Chart, lab work & pertinent test results  Airway Mallampati: III  TM Distance: >3 FB Neck ROM: Full    Dental  (+) Teeth Intact, Caps, Dental Advisory Given,    Pulmonary sleep apnea and Continuous Positive Airway Pressure Ventilation , former smoker,    breath sounds clear to auscultation       Cardiovascular hypertension, Pt. on medications  Rhythm:Regular Rate:Normal     Neuro/Psych negative neurological ROS     GI/Hepatic Neg liver ROS, GERD  ,  Endo/Other  negative endocrine ROS  Renal/GU negative Renal ROS     Musculoskeletal   Abdominal   Peds  Hematology negative hematology ROS (+)   Anesthesia Other Findings   Reproductive/Obstetrics                           Anesthesia Physical Anesthesia Plan  ASA: II  Anesthesia Plan: MAC   Post-op Pain Management:    Induction: Intravenous  Airway Management Planned: Natural Airway and Simple Face Mask  Additional Equipment:   Intra-op Plan:   Post-operative Plan:   Informed Consent: I have reviewed the patients History and Physical, chart, labs and discussed the procedure including the risks, benefits and alternatives for the proposed anesthesia with the patient or authorized representative who has indicated his/her understanding and acceptance.     Plan Discussed with: CRNA  Anesthesia Plan Comments:         Anesthesia Quick Evaluation

## 2015-07-05 NOTE — Transfer of Care (Signed)
Immediate Anesthesia Transfer of Care Note  Patient: Donald Hoover  Procedure(s) Performed: Procedure(s): COLONOSCOPY WITH PROPOFOL (N/A)  Patient Location: Endoscopy Unit  Anesthesia Type:MAC  Level of Consciousness: awake and alert   Airway & Oxygen Therapy: Patient Spontanous Breathing  Post-op Assessment: Report given to RN, Post -op Vital signs reviewed and stable and Patient moving all extremities  Post vital signs: Reviewed and stable  Last Vitals:  Filed Vitals:   07/05/15 0718 07/05/15 0914  BP: 122/72 118/72  Pulse: 82 82  Temp: 36.8 C   Resp: 12 16    Complications: No apparent anesthesia complications

## 2015-07-06 ENCOUNTER — Encounter (HOSPITAL_COMMUNITY): Payer: Self-pay | Admitting: Gastroenterology

## 2017-08-26 ENCOUNTER — Encounter (INDEPENDENT_AMBULATORY_CARE_PROVIDER_SITE_OTHER): Payer: Self-pay

## 2017-08-26 ENCOUNTER — Ambulatory Visit (INDEPENDENT_AMBULATORY_CARE_PROVIDER_SITE_OTHER): Payer: PRIVATE HEALTH INSURANCE | Admitting: Neurology

## 2017-08-26 ENCOUNTER — Encounter: Payer: Self-pay | Admitting: Neurology

## 2017-08-26 VITALS — BP 155/94 | HR 87 | Ht 72.0 in | Wt 261.0 lb

## 2017-08-26 DIAGNOSIS — Z9989 Dependence on other enabling machines and devices: Secondary | ICD-10-CM

## 2017-08-26 DIAGNOSIS — G4733 Obstructive sleep apnea (adult) (pediatric): Secondary | ICD-10-CM

## 2017-08-26 DIAGNOSIS — R635 Abnormal weight gain: Secondary | ICD-10-CM

## 2017-08-26 NOTE — Patient Instructions (Addendum)
I will prescribe a new CPAP machine. I placed the order in the chart. You will need a follow up appointment in a year if all goes well.   We will arrange for CPAP set up at home through a local DME company: [facility].  Please use your CPAP regularly. While your insurance may require that you use CPAP at least 4 hours each night on 70% of the nights, I recommend, that you not skip any nights and use it throughout the night if you can, even for scheduled naps. Getting used to CPAP and staying with the treatment long term does take time and patience and discipline. Untreated obstructive sleep apnea when it is moderate to severe can have an adverse impact on cardiovascular health and raise her risk for heart disease, arrhythmias, hypertension, congestive heart failure, stroke and diabetes. Untreated obstructive sleep apnea causes sleep disruption, nonrestorative sleep, and sleep deprivation. This can have an impact on your day to day functioning and cause daytime sleepiness and impairment of cognitive function, memory loss, mood disturbance, and problems focussing. Using CPAP regularly can improve these symptoms.

## 2017-08-26 NOTE — Progress Notes (Signed)
Subjective:    Patient ID: Donald Hoover is a 52 y.o. male.  HPI     Huston Foley, MD, PhD [facility] [address], [address] [address] [place], [place] [address]  Dear Dr. Eloise Harman,  I saw your patient, Donald Hoover, upon your kind request in my neurologic clinic today for initial consultation of his sleep disorder, in particular reevaluation of his prior diagnosis of OSA. The patient is unaccompanied today. As you know, Donald Hoover is a 52 year old right-handed gentleman with an underlying medical history of anemia, recurrent headaches, kidney stones, history of DVT, reflux disease, C. difficile colitis, elevated LFTs, and obesity, who was previously diagnosed with obstructive sleep apnea over 5 years ago and placed on CPAP therapy. He has not had reevaluation in over 5 years and has had interim weight gain. I reviewed your office note from 09/03/2016, which you kindly included. I reviewed his CPAP titration study report from 02/16/2012, at which time his weight was 225, BMI of 30.5. His baseline sleep study was on 12/20/2011, at which time his total AHI was 28.8 per hour, average oxygen saturation of 94.1%, nadir of 82%. I reviewed his CPAP compliance data from 07/27/2017 through 08/25/2017, which is a total of 30 days, during which time he used his CPAP 28 days with percent used days greater than 4 hours at 93.3%, indicating excellent compliance with an average usage of 9 hours and 12 minutes, residual AHI 0.9 per hour, leak on the low side, pressure at 13 cm. His current machine is a REM Star auto. He gets his supplies online, he does not currently have a DME company. He would like to get an updated machine and equipment. 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 He is a nonsmoker, drinks alcohol about 2-3 times per week, caffeine once a  week or so. He lives with his wife. He has 2 adopted children/stepchildren. His typical bedtime is around 10, rise time around 7:30. He does not have night to night nocturia or morning headaches, denies restless leg symptoms. His father had sleep apnea, father died at 63 from lung cancer, mother died young at 21 from lung cancer, both parents were smokers. They have 2 dogs at the house. He is fully compliant with CPAP therapy, when he stays over at his vacation home one or 29 the does not typically take his machine with him, beyond that he will pack of the machine with him. He uses a fullface mask with success, he tried other nasal mask which he did not tolerate very well. His Epworth sleepiness score is 4 out of 24, fatigue score is 9 out of 63. When he first started using CPAP he felt much improved. He has been compliant since then. He does admit that he has gained weight over time.  His Past Medical History Is Significant For: Past Medical History:  Diagnosis Date  . DVT (deep venous thrombosis) (HCC) ~ 2002   left calf  - following traumatic leg injury  . GERD (gastroesophageal reflux disease)   . GERD (gastroesophageal reflux disease)   . Headache   . Hypercholesteremia   . Hypertension   . Kidney stones   . OSA on CPAP     His Past Surgical History Is Significant For: Past Surgical History:  Procedure Laterality Date  . ANTERIOR CERVICAL DECOMP/DISCECTOMY  FUSION  06/2007   Hattie Perch/notes 11/09/2010  . BACK SURGERY    . BALLOON DILATION  12/23/2011   Procedure: BALLOON DILATION;  Surgeon: Willis ModenaWilliam Outlaw, MD;  Location: [facility];  Service: Endoscopy;  Laterality: N/A;  . COLONOSCOPY    . COLONOSCOPY WITH PROPOFOL N/A 07/05/2015   Procedure: COLONOSCOPY WITH PROPOFOL;  Surgeon: Willis ModenaWilliam Outlaw, MD;  [facility];  Service: Endoscopy;  Laterality: N/A;  . CYSTOSCOPY/RETROGRADE/URETEROSCOPY/STONE EXTRACTION WITH BASKET     Hattie Perch/notes 11/20/2010  . ESOPHAGOGASTRODUODENOSCOPY  12/23/2011    Procedure: ESOPHAGOGASTRODUODENOSCOPY (EGD);  Surgeon: Willis ModenaWilliam Outlaw, MD;  [facility];  Service: Endoscopy;  Laterality: N/A;  . SHOULDER ARTHROSCOPY Left 09/2005   Hattie Perch/notes 11/20/2010    His Family History Is Significant For: Family History  Problem Relation Age of Onset  . Colon polyps Father   . Lung cancer Father   . Lung cancer Mother     His Social History Is Significant For: Social History   Socioeconomic History  . Marital status: Married    Spouse name: None  . Number of children: None  . Years of education: None  . Highest education level: None  Social Needs  . Financial resource strain: None  . Food insecurity - worry: None  . Food insecurity - inability: None  . Transportation needs - medical: None  . Transportation needs - non-medical: None  Occupational History  . None  Tobacco Use  . Smoking status: Former Games developermoker  . Smokeless tobacco: Never Used  . Tobacco comment: "I might have smoked a pack of cigarettes in my life"  Substance and Sexual Activity  . Alcohol use: Yes    Alcohol/week: 3.6 oz    Types: 6 Cans of beer per week  . Drug use: No  . Sexual activity: Yes  Other Topics Concern  . None  Social History Narrative  . None    His Allergies Are:  No Known Allergies:   His Current Medications Are:  Outpatient Encounter Medications as of 08/26/2017  Medication Sig  . aspirin EC 81 MG tablet Take 162 mg by mouth daily.  . cyclobenzaprine (FLEXERIL) 10 MG tablet Take 10 mg by mouth 3 (three) times daily as needed for muscle spasms.  Marland Kitchen. ezetimibe (ZETIA) 10 MG tablet Take 10 mg by mouth at bedtime.   . fish oil-omega-3 fatty acids 1000 MG capsule Take 2 g by mouth 2 (two) times daily.   Marland Kitchen. GLUCOSAMINE PO Take 2 tablets by mouth daily.  . Multiple Vitamins-Minerals (MULTIVITAMIN WITH MINERALS) tablet Take 1 tablet by mouth daily.  Marland Kitchen. olmesartan (BENICAR) 20 MG tablet Take 20 mg by mouth at bedtime.   . pravastatin (PRAVACHOL) 40 MG tablet  Take 40 mg by mouth at bedtime.   . [DISCONTINUED] esomeprazole (NEXIUM) 40 MG capsule Take 1 capsule (40 mg total) by mouth 2 (two) times daily. (Patient taking differently: Take 40 mg by mouth 2 (two) times daily as needed. )  . [DISCONTINUED] ibuprofen (ADVIL,MOTRIN) 200 MG tablet Take 600 mg by mouth daily as needed for moderate pain.   No facility-administered encounter medications on file as of 08/26/2017.   :  Review of Systems:  Out of a complete 14 point review of systems, all are reviewed and negative with the exception of these symptoms as listed below: Review of Systems  Neurological:       Pt presents today to discuss his sleep. Pt has been on cpap since 2013 and is asking for a new cpap. Pt  buys his supplies offline and does not have a local DME.  Epworth Sleepiness Scale 0= would never doze 1= slight chance of dozing 2= moderate chance of dozing 3= high chance of dozing  Sitting and reading: 1 Watching TV: 1 Sitting inactive in a public place (ex. Theater or meeting): 0 As a passenger in a car for an hour without a break: 0 Lying down to rest in the afternoon: 1 Sitting and talking to someone: 0 Sitting quietly after lunch (no alcohol): 1 In a car, while stopped in traffic: 0 Total: 4     Objective:  Neurological Exam  Physical Exam Physical Examination:   Vitals:   08/26/17 1102  BP: (!) 155/94  Pulse: 87   General Examination: The patient is a very pleasant 52 y.o. male in no acute distress. He appears well-developed and well-nourished and well groomed.   HEENT: Normocephalic, atraumatic, pupils are equal, round and reactive to light and accommodation. Extraocular tracking is good without limitation to gaze excursion or nystagmus noted. Normal smooth pursuit is noted. Hearing is grossly intact. Tympanic membranes are clear bilaterally. Face is symmetric with normal facial animation and normal facial sensation. Speech is clear with no dysarthria noted. There  is no hypophonia. There is no lip, neck/head, jaw or voice tremor. Neck is supple with full range of passive and active motion. Oropharynx exam reveals: mild mouth dryness, adequate dental hygiene and moderate airway crowding, due to thicker soft palate, tonsils of 1-2+, Mallampati is class II. Tongue protrudes centrally and palate elevates symmetrically. Neck size is 19 7/8 inches.  Chest: Clear to auscultation without wheezing, rhonchi or crackles noted.  Heart: S1+S2+0, regular and normal without murmurs, rubs or gallops noted.   Abdomen: Soft, non-tender and non-distended with normal bowel sounds appreciated on auscultation.  Extremities: There is no pitting edema in the distal lower extremities bilaterally. Pedal pulses are intact.  Skin: Warm and dry without trophic changes noted.  Musculoskeletal: exam reveals no obvious joint deformities, tenderness or joint swelling or erythema.   Neurologically:  Mental status: The patient is awake, alert and oriented in all 4 spheres. His immediate and remote memory, attention, language skills and fund of knowledge are appropriate. There is no evidence of aphasia, agnosia, apraxia or anomia. Speech is clear with normal prosody and enunciation. Thought process is linear. Mood is normal and affect is normal.  Cranial nerves II - XII are as described above under HEENT exam. In addition: shoulder shrug is normal with equal shoulder height noted. Motor exam: Normal bulk, strength and tone is noted. There is no drift, tremor or rebound. Romberg is negative. Reflexes are 1+ throughout. Fine motor skills and coordination: intact with normal finger taps, normal hand movements, normal rapid alternating patting, normal foot taps and normal foot agility.  Cerebellar testing: No dysmetria or intention tremor. There is no truncal or gait ataxia.  Sensory exam: intact to light touch in the upper and lower extremities.  Gait, station and balance: He stands easily. No  veering to one side is noted. No leaning to one side is noted. Posture is age-appropriate and stance is narrow based. Gait shows normal stride length and normal pace. No problems turning are noted. Tandem walk is unremarkable.    Assessment and Plan:   In summary, Donald Hoover is a very pleasant 52 y.o.-year old male with an underlying medical history of anemia, recurrent headaches, kidney stones, history of DVT, reflux disease, C. difficile colitis, elevated LFTs, and obesity, who  was previously diagnosed with obstructive sleep apnea and placed on CPAP therapy. I reviewed his sleep study results 2013. He has been compliant with his CPAP machine. He has an older machine and would benefit from an updated machine and supplies. I provided a prescription for a new CPAP machine which we will send to a DME company locally. He does not currently have a preference as to his DME company. He has been getting supplies online but would be willing to get plugged in with a actual DME provider which I recommend. He has benefited from CPAP therapy. He has gained weight time but current setting at 13 cm seems to be adequate, AHI on average is less than 1 per hour. Leak also acceptable, he has been using a medium full face mask which he prefers. I suggested a yearly checkup routinely so long as he is able to get a new machine and supplies on a regular basis. I explained the risks and ramifications of untreated moderate to severe OSA, especially with respect to developing cardiovascular disease down the Road, including congestive heart failure, difficult to treat hypertension, cardiac arrhythmias, or stroke. Even type 2 diabetes has, in part, been linked to untreated OSA. Symptoms of untreated OSA include daytime sleepiness, memory problems, mood irritability and mood disorder such as depression and anxiety, lack of energy, as well as recurrent headaches, especially morning headaches. We talked about trying to maintain a  healthy lifestyle in general, as well as the importance of weight control. I explained the importance of being compliant with PAP treatment, not only for insurance purposes but primarily to improve His symptoms, and for the patient's long term health benefit, including to reduce His cardiovascular risks. I answered all his questions today and he was in agreement. Thank you very much for allowing me to participate in the care of this nice patient. If I can be of any further assistance to you please do not hesitate to call me at [phone].  Sincerely,   Huston Foley, MD, PhD

## 2018-08-26 ENCOUNTER — Ambulatory Visit: Payer: PRIVATE HEALTH INSURANCE | Admitting: Neurology

## 2018-11-05 ENCOUNTER — Other Ambulatory Visit: Payer: Self-pay

## 2018-11-05 ENCOUNTER — Ambulatory Visit (HOSPITAL_COMMUNITY)
Admission: EM | Admit: 2018-11-05 | Discharge: 2018-11-05 | Disposition: A | Discharge status: Home / Self Care | Payer: 59 | Attending: Physician Assistant | Admitting: Physician Assistant

## 2018-11-05 ENCOUNTER — Encounter (HOSPITAL_COMMUNITY): Payer: Self-pay

## 2018-11-05 ENCOUNTER — Ambulatory Visit (INDEPENDENT_AMBULATORY_CARE_PROVIDER_SITE_OTHER): Payer: 59

## 2018-11-05 DIAGNOSIS — M79672 Pain in left foot: Secondary | ICD-10-CM

## 2018-11-05 MED ORDER — MELOXICAM 7.5 MG PO TABS: 7.5000 mg | ORAL_TABLET | Freq: Every day | ORAL | 0 refills | Status: DC

## 2018-11-05 NOTE — Discharge Instructions (Signed)
X-ray negative for fracture or dislocation.  Continue Mobic for the next 7 to 10 days.  Ice compress, elevation, ankle brace during activity.  As discussed, this may take a few weeks to completely resolve, but should be feeling better each week.  Follow-up with PCP/orthopedics if symptoms do not improving.

## 2018-11-05 NOTE — ED Provider Notes (Signed)
[facility]    CSN: [id number] Arrival date & time: 11/05/18  1001     History   Chief Complaint Chief Complaint  Patient presents with  . Ankle Pain    HPI Donald Hoover is a 53 y.o. male.   53 year old male comes in for left ankle/foot pain after injury 3 days ago. States he tripped and hyperextended his foot. Since then, has had painful weightbearing and ROM. Minimal pain at rest, pain worse with extension. Denies swelling. He has been taking mobic, ankle brace with mild relief.      Past Medical History:  Diagnosis Date  . DVT (deep venous thrombosis) (HCC) ~ 2002   left calf  - following traumatic leg injury  . GERD (gastroesophageal reflux disease)   . GERD (gastroesophageal reflux disease)   . Headache   . Hypercholesteremia   . Hypertension   . Kidney stones   . OSA on CPAP     Patient Active Problem List   Diagnosis Date Noted  . Chest pain 09/07/2014    Past Surgical History:  Procedure Laterality Date  . ANTERIOR CERVICAL DECOMP/DISCECTOMY FUSION  06/2007   Hattie Perch 11/09/2010  . BACK SURGERY    . BALLOON DILATION  12/23/2011   Procedure: BALLOON DILATION;  Surgeon: Willis Modena, MD;  Location: [facility];  Service: Endoscopy;  Laterality: N/A;  . COLONOSCOPY    . COLONOSCOPY WITH PROPOFOL N/A 07/05/2015   Procedure: COLONOSCOPY WITH PROPOFOL;  Surgeon: Willis Modena, MD;  Location: [facility] END[facility]COPY;  Service: Endoscopy;  Laterality: N/A;  . CYSTOSCOPY/RETROGRADE/URETEROSCOPY/STONE EXTRACTION WITH BASKET     Hattie Perch 11/20/2010  . ESOPHAGOGASTRODUODENOSCOPY  12/23/2011   Procedure: ESOPHAGOGASTRODUODENOSCOPY (EGD);  Surgeon: Willis Modena, MD;  [facility];  Service: Endoscopy;  Laterality: N/A;  . SHOULDER ARTHROSCOPY Left 09/2005   Hattie Perch 11/20/2010       Home Medications    Prior to Admission medications   Medication Sig Start Date End Date Taking? Authorizing Provider  aspirin EC 81 MG tablet Take 162 mg by mouth daily.     [provider]  cyclobenzaprine (FLEXERIL) 10 MG tablet Take 10 mg by mouth 3 (three) times daily as needed for muscle spasms.    [provider]  ezetimibe (ZETIA) 10 MG tablet Take 10 mg by mouth at bedtime.     [provider]  fish oil-omega-3 fatty acids 1000 MG capsule Take 2 g by mouth 2 (two) times daily.     [provider]  GLUCOSAMINE PO Take 2 tablets by mouth daily.    [provider]  meloxicam (MOBIC) 7.5 MG tablet Take 1 tablet (7.5 mg total) by mouth daily. 11/05/18   Cathie Hoops, Seanpaul Preece V, PA-C  Multiple Vitamins-Minerals (MULTIVITAMIN WITH MINERALS) tablet Take 1 tablet by mouth daily.    [provider]  olmesartan (BENICAR) 20 MG tablet Take 20 mg by mouth at bedtime.     [provider]  pravastatin (PRAVACHOL) 40 MG tablet Take 40 mg by mouth at bedtime.     [provider]    Family History Family History  Problem Relation Age of Onset  . Colon polyps Father   . Lung cancer Father   . Lung cancer Mother     Social History Social History   Tobacco Use  . Smoking status: Former Games developer  . Smokeless tobacco: Never Used  . Tobacco comment: "I might have smoked a pack of cigarettes in my life"  Substance Use Topics  .  Alcohol use: Yes    Alcohol/week: 6.0 standard drinks    Types: 6 Cans of beer per week  . Drug use: No     Allergies   Patient has no known allergies.   Review of Systems Review of Systems  Reason unable to perform ROS: See HPI as above.     Physical Exam Triage Vital Signs ED Triage Vitals  Enc Vitals Group     BP 11/05/18 1019 118/86     Pulse Rate 11/05/18 1019 82     Resp 11/05/18 1019 18     Temp 11/05/18 1019 98 F (36.7 C)     Temp Source 11/05/18 1019 Oral     SpO2 11/05/18 1019 100 %     Weight --      Height --      Head Circumference --      Peak Flow --      Pain Score 11/05/18 1018 7     Pain Loc --      Pain Edu? --      Excl. in GC? --    No  data found.  Updated Vital Signs BP 118/86 (BP Location: Left Arm)   Pulse 82   Temp 98 F (36.7 C) (Oral)   Resp 18   SpO2 100%      Physical Exam Constitutional:      General: He is not in acute distress.    Appearance: He is well-developed. He is not diaphoretic.  HENT:     Head: Normocephalic and atraumatic.  Eyes:     Conjunctiva/sclera: Conjunctivae normal.     Pupils: Pupils are equal, round, and reactive to light.  Musculoskeletal:     Comments: No obvious swelling, erythema, warmth. No tenderness to palpation of ankle, foot. Decreased ROM of ankle. Pain worse with extension. Strength normal and equal bilaterally. Sensation intact and equal bilaterally. Pedal pulse 2+, cap refill <2s.   Neurological:     Mental Status: He is alert and oriented to person, place, and time.     UC Treatments / Results  Labs (all labs ordered are listed, but only abnormal results are displayed) Labs Reviewed - No data to display  EKG None  Radiology Dg Ankle Complete Left  Result Date: 11/05/2018 CLINICAL DATA:  Acute left ankle pain after fall 2 days ago. EXAM: LEFT ANKLE COMPLETE - 3+ VIEW COMPARISON:  Left ankle x-rays dated June 03, 2005. FINDINGS: No acute fracture or dislocation. The ankle mortise is symmetric. The talar dome is intact. No tibiotalar joint effusion. Joint spaces are preserved. Large plantar and Achilles enthesophytes. Achilles shadow is within normal limits. Mild lateral ankle soft tissue swelling. IMPRESSION: 1. Mild lateral ankle soft tissue swelling. No acute osseous abnormality. Electronically Signed   By: Obie DredgeWilliam T Derry M.D.   On: 11/05/2018 11:08    Procedures Procedures (including critical care time)  Medications Ordered in UC Medications - No data to display  Initial Impression / Assessment and Plan / UC Course  I have reviewed the triage vital signs and the nursing notes.  Pertinent labs & imaging results that were available during my care of  the patient were reviewed by me and considered in my medical decision making (see chart for details).    Given no tenderness on palpation, low suspicion for fractures. However, patient states has had fractures to the ankle in the past and would like xray. Xray ordered for further evaluation.   X-ray negative for fracture or dislocation.  Continue Mobic, ice compress, elevation, ankle brace during activity.  Patient has crutches at home, can use for symptomatic relief.  Discussed with patient this may take a few weeks to completely resolve, but should be feeling better each week.  Return precautions given.  Patient expresses understanding and agrees to plan.  Final Clinical Impressions(s) / UC Diagnoses   Final diagnoses:  Left foot pain    ED Prescriptions    Medication Sig Dispense Auth. Provider   meloxicam (MOBIC) 7.5 MG tablet Take 1 tablet (7.5 mg total) by mouth daily. 15 tablet Threasa Alpha, [place] 11/05/18 1131

## 2018-11-05 NOTE — ED Triage Notes (Signed)
Patient presents to Urgent Care with complaints of left ankle/ foot pain since the day before yesterday when he tripped and hyperextended his foot. Patient states he is able to bear weight but it is very painful, has the ankle braced upon arrival.

## 2019-01-05 ENCOUNTER — Other Ambulatory Visit: Payer: Self-pay | Admitting: Internal Medicine

## 2019-01-05 DIAGNOSIS — I10 Essential (primary) hypertension: Secondary | ICD-10-CM

## 2019-01-18 ENCOUNTER — Other Ambulatory Visit: Payer: Self-pay | Admitting: Internal Medicine

## 2019-01-18 ENCOUNTER — Ambulatory Visit
Admission: RE | Admit: 2019-01-18 | Discharge: 2019-01-18 | Disposition: A | Discharge status: Home / Self Care | Payer: 59 | Source: Ambulatory Visit | Attending: Internal Medicine | Admitting: Internal Medicine

## 2019-01-18 DIAGNOSIS — I10 Essential (primary) hypertension: Secondary | ICD-10-CM

## 2019-01-26 ENCOUNTER — Ambulatory Visit
Admission: RE | Admit: 2019-01-26 | Discharge: 2019-01-26 | Disposition: A | Discharge status: Home / Self Care | Payer: No Typology Code available for payment source | Source: Ambulatory Visit | Attending: Internal Medicine | Admitting: Internal Medicine

## 2019-01-26 DIAGNOSIS — I10 Essential (primary) hypertension: Secondary | ICD-10-CM

## 2019-05-20 NOTE — Progress Notes (Signed)
Cardiology Office Note   Date:  05/21/2019   ID:  SEABRON IANNELLO, DOB Nov 15, 1965, MRN [id number]  PCP:  Leanna Battles, MD  Cardiologist:   Dorris Carnes, MD    Patient referred by Dr. Sharlett Iles for abnormal cardiac CT.   History of Present Illness: Donald Hoover is a 53 y.o. male with no known history of coronary artery disease.  He has a history of hypertension and hyperlipidemia.  He is followed by Valetta Fuller in medicine clinic.  He had a CT calcium score done.recently  This showed a calcium score of 18 with one focal area of calcififcation  in the RCA.  (Proximal).  The patient says he has had high cholesterol it has been treated at one point in time his triglycerides were over 700.  He does admit to eating a lot of carbohydrates.  He has been on statins in the past and did not tolerate due to achiness except for pravachol He checked with pharmacy on [address] [place] and found it was not covered   The patient denies chest pain.  Breathing is good.  He is fairly active.  He does not smoke.  His father had a history of CAD and peripheral vascular disease his father was a big smoker.       Current Meds  Medication Sig  . aspirin EC 81 MG tablet Take 162 mg by mouth daily.  . cyclobenzaprine (FLEXERIL) 10 MG tablet Take 10 mg by mouth 3 (three) times daily as needed for muscle spasms.  Marland Kitchen ezetimibe (ZETIA) 10 MG tablet Take 10 mg by mouth at bedtime.   . fish oil-omega-3 fatty acids 1000 MG capsule Take 2 g by mouth 2 (two) times daily.   Marland Kitchen GLUCOSAMINE PO Take 2 tablets by mouth daily.  . meloxicam (MOBIC) 7.5 MG tablet Take 1 tablet (7.5 mg total) by mouth daily.  . Multiple Vitamins-Minerals (MULTIVITAMIN WITH MINERALS) tablet Take 1 tablet by mouth daily.  Marland Kitchen olmesartan (BENICAR) 20 MG tablet Take 20 mg by mouth at bedtime.   . pravastatin (PRAVACHOL) 40 MG tablet Take 40 mg by mouth at bedtime.      Allergies:   Patient has no known allergies.   Past Medical History:   Diagnosis Date  . DVT (deep venous thrombosis) ([place]) ~ 2002   left calf  - following traumatic leg injury  . GERD (gastroesophageal reflux disease)   . GERD (gastroesophageal reflux disease)   . Headache   . Hypercholesteremia   . Hypertension   . Kidney stones   . OSA on CPAP     Past Surgical History:  Procedure Laterality Date  . ANTERIOR CERVICAL DECOMP/DISCECTOMY FUSION  06/2007   Archie Endo 11/09/2010  . BACK SURGERY    . BALLOON DILATION  12/23/2011   Procedure: BALLOON DILATION;  Surgeon: Arta Silence, MD;  Location: [facility] ENDOSCOPY;  Service: Endoscopy;  Laterality: N/A;  . COLONOSCOPY    . COLONOSCOPY WITH PROPOFOL N/A 07/05/2015   Procedure: COLONOSCOPY WITH PROPOFOL;  Surgeon: Arta Silence, MD;  Location: [facility] ENDOSCOPY;  Service: Endoscopy;  Laterality: N/A;  . CYSTOSCOPY/RETROGRADE/URETEROSCOPY/STONE EXTRACTION WITH BASKET     Archie Endo 11/20/2010  . ESOPHAGOGASTRODUODENOSCOPY  12/23/2011   Procedure: ESOPHAGOGASTRODUODENOSCOPY (EGD);  Surgeon: Arta Silence, MD;  Location: [facility] ENDOSCOPY;  Service: Endoscopy;  Laterality: N/A;  . SHOULDER ARTHROSCOPY Left 09/2005   Archie Endo 11/20/2010     Social History:  The patient  reports that he has quit smoking. He has never used smokeless  tobacco. He reports current alcohol use of about 6.0 standard drinks of alcohol per week. He reports that he does not use drugs.   Family History:  The patient's family history includes Colon polyps in his father; Lung cancer in his father and mother.    ROS:  Please see the history of present illness. All other systems are reviewed and  Negative to the above problem except as noted.    PHYSICAL EXAM: VS:  BP 132/80   Pulse 87   Ht 6' (1.829 m)   Wt 270 lb (122.5 kg)   BMI 36.62 kg/m   GEN: Morbidly obese, in no acute distress  HEENT: normal  Neck: no JVD, carotid bruits, or masses Cardiac: RRR; no murmurs, rubs, or gallops,no edema  Respiratory:  clear to auscultation bilaterally, normal work  of breathing GI: soft, nontender, nondistended, + BS  No hepatomegaly  MS: no deformity Moving all extremities   Skin: warm and dry, no rash Neuro:  Strength and sensation are intact Psych: euthymic mood, full affect   EKG:  EKG is ordered today.  Sinus rhythm 87 bpm.  Low voltage.  Possible anterior MI.  Unchanged from EKG in 2016.   Lipid Panel    Component Value Date/Time   CHOL 147 09/08/2014 0450   TRIG 323 (H) 09/08/2014 0450   HDL 29 (L) 09/08/2014 0450   CHOLHDL 5.1 09/08/2014 0450   VLDL 65 (H) 09/08/2014 0450   LDLCALC 53 09/08/2014 0450      Wt Readings from Last 3 Encounters:  05/21/19 270 lb (122.5 kg)  08/26/17 261 lb (118.4 kg)  07/05/15 226 lb (102.5 kg)      ASSESSMENT AND PLAN:  Coronary artery disease.  The patient has evidence of mild calcification with a focal site in the proximal RCA.  He does have cardiac risk factors (family, hypertension, hyperlipidemia, and obesity).  We discussed this.  Recent labs in February his HDL was 106.  The patient says he could not tolerate other statins he had tried a few in the past and had achiness.  He is on Zetia 10 and pravastatin 40.  I will will review with our lipid clinic.  He went to his pharmacy and said it was not he heard it was not approved to have Repatha we will check on this  Discussed diet especially carbohydrate intake.  Keep on medicines for now.  2  Hypertension good control.  3  Morbid obesity discussed weight loss.  We will be in touch with the patient once I have reviewed with the lipid clinic regarding Repatha approval.   Current medicines are reviewed at length with the patient today.  The patient does not have concerns regarding medicines.  Signed, Dietrich Pates, MD  05/21/2019 9:10 AM    [facility] [address] [place], [place], [place]  [address] Phone: [phone]; Fax: [phone]

## 2019-05-21 ENCOUNTER — Ambulatory Visit (INDEPENDENT_AMBULATORY_CARE_PROVIDER_SITE_OTHER): Payer: 59 | Admitting: Internal Medicine

## 2019-05-21 ENCOUNTER — Encounter: Payer: Self-pay | Admitting: Internal Medicine

## 2019-05-21 ENCOUNTER — Other Ambulatory Visit: Payer: Self-pay

## 2019-05-21 VITALS — BP 132/80 | HR 87 | Ht 72.0 in | Wt 270.0 lb

## 2019-05-21 DIAGNOSIS — I10 Essential (primary) hypertension: Secondary | ICD-10-CM

## 2019-05-21 DIAGNOSIS — E782 Mixed hyperlipidemia: Secondary | ICD-10-CM | POA: Diagnosis not present

## 2019-05-21 NOTE — Patient Instructions (Addendum)
Medication Instructions:  No changes *If you need a refill on your cardiac medications before your next appointment, please call your pharmacy*  Lab Work: none If you have labs (blood work) drawn today and your tests are completely normal, you will receive your results only by: Marland Kitchen MyChart Message (if you have MyChart) OR . A paper copy in the mail If you have any lab test that is abnormal or we need to change your treatment, we will call you to review the results.  Testing/Procedures: none  Follow-Up: At [facility], you and your health needs are our priority.  As part of our continuing mission to provide you with exceptional heart care, we have created designated Provider Care Teams.  These Care Teams include your primary Cardiologist (physician) and Advanced Practice Providers (APPs -  Physician Assistants and Nurse Practitioners) who all work together to provide you with the care you need, when you need it.  Your next appointment:   15 months (Jan 2022)  The format for your next appointment:   In Person  Provider:   Dorris Carnes, MD  Other Instructions We will discuss with our pharmacist  - Repatha coverage options

## 2019-05-25 ENCOUNTER — Telehealth: Payer: Self-pay | Admitting: Pharmacist

## 2019-05-25 NOTE — Telephone Encounter (Signed)
His insurance denied Repatha since he doesn't meet their definition of ASCVD. We can try sending in an appeals to see if they'll overturn their decision - it usually takes a few weeks to a month before we hear back on appeals.    ===View-only below this line=== ----- Message ----- From: [name], MD Sent: 05/21/2019   1:16 PM EST To: [name], RPH  Pt has LDL of 106   Cant tolerate other statins  No prava and Zetia.   He was told Repatha would not be covered    But he does have plaquing    Is there a way to get approval?

## 2019-05-25 NOTE — Progress Notes (Signed)
Thank you.   He does have CAD in prox LAD   Dont want event to happen

## 2019-05-26 NOTE — Telephone Encounter (Signed)
Appeals faxed today.

## 2019-06-17 ENCOUNTER — Other Ambulatory Visit: Payer: Self-pay | Admitting: Unknown Physician Specialty

## 2019-06-17 ENCOUNTER — Telehealth: Payer: Self-pay | Admitting: Unknown Physician Specialty

## 2019-06-17 DIAGNOSIS — U071 COVID-19: Secondary | ICD-10-CM

## 2019-06-17 NOTE — Telephone Encounter (Signed)
Symptoms for 6 days.  High fever at night.   I connected by phone with Donald Hoover on 06/17/2019 at 4:06 PM to discuss the potential use of an new treatment for mild to moderate COVID-19 viral infection in non-hospitalized patients.  This patient is a 53 y.o. male that meets the FDA criteria for Emergency Use Authorization of bamlanivimab or casirivimab\imdevimab.  Has a (+) direct SARS-CoV-2 viral test result  Has mild or moderate COVID-19   Is ? 53 years of age and weighs ? 40 kg  Is NOT hospitalized due to COVID-19  Is NOT requiring oxygen therapy or requiring an increase in baseline oxygen flow rate due to COVID-19  Is within 10 days of symptom onset  Has at least one of the high risk factor(s) for progression to severe COVID-19 and/or hospitalization as defined in EUA.  Specific high risk criteria : BMI >/= 35   I have spoken and communicated the following to the patient or parent/caregiver:  1. FDA has authorized the emergency use of bamlanivimab and casirivimab\imdevimab for the treatment of mild to moderate COVID-19 in adults and pediatric patients with positive results of direct SARS-CoV-2 viral testing who are 9 years of age and older weighing at least 40 kg, and who are at high risk for progressing to severe COVID-19 and/or hospitalization.  2. The significant known and potential risks and benefits of bamlanivimab and casirivimab\imdevimab, and the extent to which such potential risks and benefits are unknown.  3. Information on available alternative treatments and the risks and benefits of those alternatives, including clinical trials.  4. Patients treated with bamlanivimab and casirivimab\imdevimab should continue to self-isolate and use infection control measures (e.g., wear mask, isolate, social distance, avoid sharing personal items, clean and disinfect "high touch" surfaces, and frequent handwashing) according to CDC guidelines.   5. The patient or  parent/caregiver has the option to accept or refuse bamlanivimab or casirivimab\imdevimab .  After reviewing this information with the patient, The patient agreed to proceed with receiving the bamlanimivab infusion and will be provided a copy of the Fact sheet prior to receiving the infusion.Donald Hoover 06/17/2019 4:06 PM

## 2019-06-18 ENCOUNTER — Telehealth (HOSPITAL_COMMUNITY): Payer: Self-pay

## 2019-06-18 NOTE — Telephone Encounter (Signed)
Patient has been without fever for 29 hours without medication.  They are declining the infusion at this time.

## 2019-07-05 ENCOUNTER — Telehealth: Payer: Self-pay | Admitting: Internal Medicine

## 2019-07-05 MED ORDER — REPATHA SURECLICK 140 MG/ML ~~LOC~~ SOAJ: 1.0000 "pen " | SUBCUTANEOUS | 11 refills | Status: DC

## 2019-07-05 NOTE — Telephone Encounter (Signed)
Rx sent to pharmacy. Verbal instructions on how to give the medication were provided. Patient wife was sitting beside him and also heard the instructions.  Rx copay card faxed to pharmacy  PA approved through 11/30/19- requested a faxed copy of the approval letter from [facility]

## 2019-07-05 NOTE — Telephone Encounter (Signed)
Patient calling because he states his repatha was approved and he needs the prescription, but has not heard anything. He also says he would like a call back explaining how to use the medication. He also wanted to inform that it is okay to discuss medical with his wife Kalix Meinecke.

## 2019-07-06 ENCOUNTER — Telehealth: Payer: Self-pay | Admitting: Internal Medicine

## 2019-07-06 DIAGNOSIS — E782 Mixed hyperlipidemia: Secondary | ICD-10-CM

## 2019-07-06 NOTE — Telephone Encounter (Signed)
Patient's wife returned call to clinic. Advised her pt should continue Repatha and pravastatin and can stop Zetia. She verbalized understanding. Scheduled f/u fasting labs in the end of February to assess efficacy of Repatha.

## 2019-07-06 NOTE — Telephone Encounter (Signed)
Patient's wife called wanting to know does Mr. Koral continue to take the ezetimibe (ZETIA) 10 MG tablet and pravastatin (PRAVACHOL) 40 MG table,and along with the  Evolocumab (REPATHA SURECLICK) 749 MG/ML SOAJ.  Or does he just take the  Evolocumab (REPATHA SURECLICK) 449 MG/ML SOAJ alone.

## 2019-07-06 NOTE — Telephone Encounter (Signed)
Ok to stop zetia. Will need to continue pravastatin. Called and left voicemail for wife to call back

## 2019-08-30 ENCOUNTER — Other Ambulatory Visit: Payer: Self-pay

## 2019-08-30 ENCOUNTER — Other Ambulatory Visit: Payer: 59 | Admitting: *Deleted

## 2019-08-30 DIAGNOSIS — E782 Mixed hyperlipidemia: Secondary | ICD-10-CM

## 2019-08-30 LAB — HEPATIC FUNCTION PANEL
ALT: 63 IU/L — ABNORMAL HIGH (ref 0–44)
AST: 35 IU/L (ref 0–40)
Albumin: 4.7 g/dL (ref 3.8–4.9)
Alkaline Phosphatase: 71 IU/L (ref 39–117)
Bilirubin Total: 0.3 mg/dL (ref 0.0–1.2)
Bilirubin, Direct: 0.1 mg/dL (ref 0.00–0.40)
Total Protein: 7.1 g/dL (ref 6.0–8.5)

## 2019-08-30 LAB — LIPID PANEL
Chol/HDL Ratio: 2.1 ratio (ref 0.0–5.0)
Cholesterol, Total: 100 mg/dL (ref 100–199)
HDL: 47 mg/dL (ref >39)
LDL Chol Calc (NIH): 26 mg/dL (ref 0–99)
Triglycerides: 168 mg/dL — ABNORMAL HIGH (ref 0–149)
VLDL Cholesterol Cal: 27 mg/dL (ref 5–40)

## 2019-09-14 ENCOUNTER — Telehealth: Payer: Self-pay | Admitting: *Deleted

## 2019-09-14 DIAGNOSIS — E782 Mixed hyperlipidemia: Secondary | ICD-10-CM

## 2019-09-14 MED ORDER — PRAVASTATIN SODIUM 20 MG PO TABS: 20.0000 mg | ORAL_TABLET | Freq: Every evening | ORAL | 3 refills | Status: DC

## 2019-09-14 NOTE — Telephone Encounter (Signed)
-----   Message from Pricilla Riffle, MD sent at 08/30/2019  8:36 PM EST ----- Lipids are excellent   ALT is very mildly elevated. I would recomm continuing Repatha.  Cut pravastatin down to 20 mg per day Repeat lipids and Liver panel in 8 wks

## 2019-09-14 NOTE — Telephone Encounter (Signed)
Patient returned call. Lab results/review and recommendations have been given. He will decrease pravastatin to 20 mg daily and continue Repatha. Lab appointment made for 11/15/19.  Pt will be fasting.

## 2019-11-15 ENCOUNTER — Other Ambulatory Visit: Payer: Self-pay

## 2019-11-15 ENCOUNTER — Other Ambulatory Visit: Payer: 59 | Admitting: *Deleted

## 2019-11-15 DIAGNOSIS — E782 Mixed hyperlipidemia: Secondary | ICD-10-CM

## 2019-11-15 LAB — HEPATIC FUNCTION PANEL
ALT: 81 IU/L — ABNORMAL HIGH (ref 0–44)
AST: 44 IU/L — ABNORMAL HIGH (ref 0–40)
Albumin: 4.8 g/dL (ref 3.8–4.9)
Alkaline Phosphatase: 80 IU/L (ref 39–117)
Bilirubin Total: 0.3 mg/dL (ref 0.0–1.2)
Bilirubin, Direct: 0.13 mg/dL (ref 0.00–0.40)
Total Protein: 6.9 g/dL (ref 6.0–8.5)

## 2019-11-15 LAB — LIPID PANEL
Chol/HDL Ratio: 2.9 ratio (ref 0.0–5.0)
Cholesterol, Total: 121 mg/dL (ref 100–199)
HDL: 42 mg/dL (ref >39)
LDL Chol Calc (NIH): 49 mg/dL (ref 0–99)
Triglycerides: 179 mg/dL — ABNORMAL HIGH (ref 0–149)
VLDL Cholesterol Cal: 30 mg/dL (ref 5–40)

## 2019-11-17 ENCOUNTER — Telehealth: Payer: Self-pay | Admitting: Internal Medicine

## 2019-11-17 NOTE — Telephone Encounter (Signed)
New Message  Patient is returning call for lab results. Please give a call back.

## 2019-11-17 NOTE — Telephone Encounter (Signed)
The patient has been notified of the lab result and verbalized understanding.  All questions (if any) were answered. Sigurd Sos, RN 11/17/2019 9:51 AM

## 2019-11-25 ENCOUNTER — Telehealth: Payer: Self-pay | Admitting: *Deleted

## 2019-11-25 DIAGNOSIS — R7989 Other specified abnormal findings of blood chemistry: Secondary | ICD-10-CM

## 2019-11-25 DIAGNOSIS — R945 Abnormal results of liver function studies: Secondary | ICD-10-CM

## 2019-11-25 DIAGNOSIS — E782 Mixed hyperlipidemia: Secondary | ICD-10-CM

## 2019-11-25 NOTE — Telephone Encounter (Signed)
Left message on home and mobile numbers. It appears that pt has been made aware of recent lipid/liver results and pravastatin has been removed from med list.  He will need to be set up for labs in 3 months. Asked him to call back to inform/scheduled this. _______________________________________________________________   Olene Floss, RPH-CPP  11/16/2019 4:35 PM EDT    I agree with Dr. Tenny Craw. Although LFT are not dangerously high and are not 3X ULN, he is only on low intensity statin. LDL I below goal. Therefore I think it is ok to stop pravastatin and continue Repatha. Recheck repeat lipids and Liver panel, GGT, ANA in 12 weeks

## 2020-05-29 ENCOUNTER — Other Ambulatory Visit: Payer: Self-pay | Admitting: Internal Medicine

## 2020-07-27 NOTE — Progress Notes (Signed)
Cardiology Office Note   Date:  07/28/2020   ID:  Donald Hoover, DOB 03-30-1966, MRN [id number]  PCP:  Jarome Matin, MD  Cardiologist:   Dietrich Pates, MD    F/U of hyperlipidemia  And HTN     History of Present Illness: Donald Hoover is a 55 y.o. male with hx of HTN and HL   Also with hx of minimal coronary calcifications of CT scan   18  Focal in proximal RCA   The pt had a profound dyslipidemia  With triglycerides over 700  Ate a lot of carbs   Complained of achiness on statin   Started on Repatha   I saw the pt in 2020   Since seen he has done OK   Breathing is OK   No CP    Cut back on carbs but diet is not always optimal due to work/travel   Current Meds  Medication Sig  . aspirin EC 81 MG tablet Take 162 mg by mouth daily.  . cyclobenzaprine (FLEXERIL) 10 MG tablet Take 10 mg by mouth 3 (three) times daily as needed for muscle spasms.  Marland Kitchen diltiazem (CARDIZEM CD) 180 MG 24 hr capsule Take 180 mg by mouth daily.  . fish oil-omega-3 fatty acids 1000 MG capsule Take 2 g by mouth 2 (two) times daily.   Marland Kitchen GLUCOSAMINE PO Take 2 tablets by mouth daily.  . Multiple Vitamins-Minerals (MULTIVITAMIN WITH MINERALS) tablet Take 1 tablet by mouth daily.  Marland Kitchen olmesartan (BENICAR) 20 MG tablet Take 20 mg by mouth at bedtime.   Marland Kitchen REPATHA SURECLICK 140 MG/ML SOAJ INJECT 1 PEN INTO THE SKIN EVERY 14 DAYS.     Allergies:   Patient has no known allergies.   Past Medical History:  Diagnosis Date  . DVT (deep venous thrombosis) ([facility]) ~ 2002   left calf  - following traumatic leg injury  . GERD (gastroesophageal reflux disease)   . GERD (gastroesophageal reflux disease)   . Headache   . Hypercholesteremia   . Hypertension   . Kidney stones   . OSA on CPAP     Past Surgical History:  Procedure Laterality Date  . ANTERIOR CERVICAL DECOMP/DISCECTOMY FUSION  06/2007   Hattie Perch 11/09/2010  . BACK SURGERY    . BALLOON DILATION  12/23/2011   Procedure: BALLOON DILATION;  Surgeon:  Willis Modena, MD;  Location: [facility] ENDOSCOPY;  Service: Endoscopy;  Laterality: N/A;  . COLONOSCOPY    . COLONOSCOPY WITH PROPOFOL N/A 07/05/2015   Procedure: COLONOSCOPY WITH PROPOFOL;  Surgeon: Willis Modena, MD;  Location: [facility] ENDOSCOPY;  Service: Endoscopy;  Laterality: N/A;  . CYSTOSCOPY/RETROGRADE/URETEROSCOPY/STONE EXTRACTION WITH BASKET     Hattie Perch 11/20/2010  . ESOPHAGOGASTRODUODENOSCOPY  12/23/2011   Procedure: ESOPHAGOGASTRODUODENOSCOPY (EGD);  Surgeon: Willis Modena, MD;  Location: [facility];  Service: Endoscopy;  Laterality: N/A;  . SHOULDER ARTHROSCOPY Left 09/2005   Hattie Perch 11/20/2010     Social History:  The patient  reports that he has quit smoking. He has never used smokeless tobacco. He reports current alcohol use of about 6.0 standard drinks of alcohol per week. He reports that he does not use drugs.   Family History:  The patient's family history includes Colon polyps in his father; Lung cancer in his father and mother.    ROS:  Please see the history of present illness. All other systems are reviewed and  Negative to the above problem except as noted.    PHYSICAL EXAM: VS:  BP 130/82   Pulse 93   Ht 6' (1.829 m)   Wt 278 lb 12.8 oz (126.5 kg)   SpO2 96%   BMI 37.81 kg/m   GEN: Morbidly obese 55 yo  in no acute distress  HEENT: normal  Neck: no JVD, carotid bruits, Cardiac: RRR; no murmurs  No LE edema  Respiratory:  clear to auscultation bilaterally,  GI: soft, nontender, nondistended, + BS  No hepatomegaly  MS: no deformity Moving all extremities   Skin: warm and dry, no rash Neuro:  Strength and sensation are intact Psych: euthymic mood, full affect   EKG:  EKG is not ordered today   Lipid Panel    Component Value Date/Time   CHOL 175 07/28/2020 1115   TRIG 306 (H) 07/28/2020 1115   HDL 42 07/28/2020 1115   CHOLHDL 4.2 07/28/2020 1115   CHOLHDL 5.1 09/08/2014 0450   VLDL 65 (H) 09/08/2014 0450   LDLCALC 83 07/28/2020 1115      Wt Readings  from Last 3 Encounters:  07/28/20 278 lb 12.8 oz (126.5 kg)  05/21/19 270 lb (122.5 kg)  08/26/17 261 lb (118.4 kg)      ASSESSMENT AND PLAN:  Coronary artery disease.  The patient has evidence of mild calcification with a focal site in the proximal RCA. No symtpoms of angina   Follow  2  HL  Marked improvement in lipids on Repatha with LDL 49, HDL 42   Trig were 179   (may 2021) Discussed diet  Continue to work on wt  2  Hypertension good control.  3  Morbid obesity discussed weight loss.    Current medicines are reviewed at length with the patient today.  The patient does not have concerns regarding medicines.  Signed, Dietrich Pates, MD  07/28/2020 10:13 PM    [facility] [address] [place], [place], [place]  [address] Phone: [phone]; Fax: [phone]

## 2020-07-28 ENCOUNTER — Other Ambulatory Visit: Payer: 59 | Admitting: *Deleted

## 2020-07-28 ENCOUNTER — Other Ambulatory Visit: Payer: Self-pay

## 2020-07-28 ENCOUNTER — Encounter: Payer: Self-pay | Admitting: Internal Medicine

## 2020-07-28 ENCOUNTER — Ambulatory Visit (INDEPENDENT_AMBULATORY_CARE_PROVIDER_SITE_OTHER): Payer: 59 | Admitting: Internal Medicine

## 2020-07-28 VITALS — BP 130/82 | HR 93 | Ht 72.0 in | Wt 278.8 lb

## 2020-07-28 DIAGNOSIS — I2510 Atherosclerotic heart disease of native coronary artery without angina pectoris: Secondary | ICD-10-CM

## 2020-07-28 DIAGNOSIS — R7989 Other specified abnormal findings of blood chemistry: Secondary | ICD-10-CM

## 2020-07-28 DIAGNOSIS — R945 Abnormal results of liver function studies: Secondary | ICD-10-CM

## 2020-07-28 DIAGNOSIS — E782 Mixed hyperlipidemia: Secondary | ICD-10-CM

## 2020-07-28 NOTE — Patient Instructions (Signed)
Medication Instructions:  No changes today *If you need a refill on your cardiac medications before your next appointment, please call your pharmacy*   Lab Work: Already drawn today.  If you have labs (blood work) drawn today and your tests are completely normal, you will receive your results only by: Marland Kitchen MyChart Message (if you have MyChart) OR . A paper copy in the mail If you have any lab test that is abnormal or we need to change your treatment, we will call you to review the results.   Testing/Procedures: none   Follow-Up: At [facility], you and your health needs are our priority.  As part of our continuing mission to provide you with exceptional heart care, we have created designated Provider Care Teams.  These Care Teams include your primary Cardiologist (physician) and Advanced Practice Providers (APPs -  Physician Assistants and Nurse Practitioners) who all work together to provide you with the care you need, when you need it.  We recommend signing up for the patient portal called "MyChart".  Sign up information is provided on this After Visit Summary.  MyChart is used to connect with patients for Virtual Visits (Telemedicine).  Patients are able to view lab/test results, encounter notes, upcoming appointments, etc.  Non-urgent messages can be sent to your provider as well.   To learn more about what you can do with MyChart, go to [email or web address].    Your next appointment:   11 month(s)--December 2022.  The format for your next appointment:   In Person  Provider:   You may see Dietrich Pates, MD or one of the following Advanced Practice Providers on your designated Care Team:    Tereso Newcomer, PA-C  Chelsea Aus, [place]   Other Instructions

## 2020-07-29 LAB — HEPATIC FUNCTION PANEL
ALT: 96 IU/L — ABNORMAL HIGH (ref 0–44)
AST: 52 IU/L — ABNORMAL HIGH (ref 0–40)
Albumin: 4.8 g/dL (ref 3.8–4.9)
Alkaline Phosphatase: 83 IU/L (ref 44–121)
Bilirubin Total: 0.3 mg/dL (ref 0.0–1.2)
Bilirubin, Direct: 0.12 mg/dL (ref 0.00–0.40)
Total Protein: 7.4 g/dL (ref 6.0–8.5)

## 2020-07-29 LAB — LIPID PANEL
Chol/HDL Ratio: 4.2 ratio (ref 0.0–5.0)
Cholesterol, Total: 175 mg/dL (ref 100–199)
HDL: 42 mg/dL (ref >39)
LDL Chol Calc (NIH): 83 mg/dL (ref 0–99)
Triglycerides: 306 mg/dL — ABNORMAL HIGH (ref 0–149)
VLDL Cholesterol Cal: 50 mg/dL — ABNORMAL HIGH (ref 5–40)

## 2020-07-29 LAB — ANA: Anti Nuclear Antibody (ANA): NEGATIVE

## 2020-07-29 LAB — GAMMA GT: GGT: 81 IU/L — ABNORMAL HIGH (ref 0–65)

## 2020-11-07 ENCOUNTER — Other Ambulatory Visit: Payer: Self-pay | Admitting: Internal Medicine

## 2020-11-07 DIAGNOSIS — R911 Solitary pulmonary nodule: Secondary | ICD-10-CM

## 2020-11-19 ENCOUNTER — Emergency Department (HOSPITAL_BASED_OUTPATIENT_CLINIC_OR_DEPARTMENT_OTHER)
Admission: EM | Admit: 2020-11-19 | Discharge: 2020-11-19 | Disposition: A | Discharge status: Home / Self Care | Payer: 59 | Attending: Emergency Medicine | Admitting: Emergency Medicine

## 2020-11-19 ENCOUNTER — Encounter (HOSPITAL_BASED_OUTPATIENT_CLINIC_OR_DEPARTMENT_OTHER): Payer: Self-pay | Admitting: Emergency Medicine

## 2020-11-19 ENCOUNTER — Emergency Department (HOSPITAL_BASED_OUTPATIENT_CLINIC_OR_DEPARTMENT_OTHER): Payer: 59 | Admitting: Radiology

## 2020-11-19 ENCOUNTER — Other Ambulatory Visit: Payer: Self-pay

## 2020-11-19 DIAGNOSIS — Y92410 Unspecified street and highway as the place of occurrence of the external cause: Secondary | ICD-10-CM | POA: Insufficient documentation

## 2020-11-19 DIAGNOSIS — S3992XA Unspecified injury of lower back, initial encounter: Secondary | ICD-10-CM | POA: Diagnosis present

## 2020-11-19 DIAGNOSIS — S39012A Strain of muscle, fascia and tendon of lower back, initial encounter: Secondary | ICD-10-CM | POA: Diagnosis not present

## 2020-11-19 DIAGNOSIS — I10 Essential (primary) hypertension: Secondary | ICD-10-CM | POA: Diagnosis not present

## 2020-11-19 DIAGNOSIS — Z7982 Long term (current) use of aspirin: Secondary | ICD-10-CM | POA: Insufficient documentation

## 2020-11-19 DIAGNOSIS — Z87891 Personal history of nicotine dependence: Secondary | ICD-10-CM | POA: Insufficient documentation

## 2020-11-19 DIAGNOSIS — Z79899 Other long term (current) drug therapy: Secondary | ICD-10-CM | POA: Insufficient documentation

## 2020-11-19 MED ORDER — HYDROCODONE-ACETAMINOPHEN 5-325 MG PO TABS: 1.0000 | ORAL_TABLET | ORAL | 0 refills | Status: DC | PRN

## 2020-11-19 MED ORDER — DEXAMETHASONE SODIUM PHOSPHATE 10 MG/ML IJ SOLN
10.0000 mg | Freq: Once | INTRAMUSCULAR | Status: AC
  Administered 2020-11-19: 10 mg via INTRAMUSCULAR
  Filled 2020-11-19: qty 1

## 2020-11-19 MED ORDER — DIAZEPAM 5 MG PO TABS: 5.0000 mg | ORAL_TABLET | Freq: Two times a day (BID) | ORAL | 0 refills | Status: DC

## 2020-11-19 MED ORDER — KETOROLAC TROMETHAMINE 30 MG/ML IJ SOLN
30.0000 mg | Freq: Once | INTRAMUSCULAR | Status: AC
Start: 2020-11-19 — End: 2020-11-19
  Administered 2020-11-19: 30 mg via INTRAMUSCULAR
  Filled 2020-11-19: qty 1
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
## 2020-11-22 ENCOUNTER — Ambulatory Visit
Admission: RE | Admit: 2020-11-22 | Discharge: 2020-11-22 | Disposition: A | Discharge status: Home / Self Care | Payer: 59 | Source: Ambulatory Visit | Attending: Internal Medicine | Admitting: Internal Medicine

## 2020-11-22 DIAGNOSIS — R911 Solitary pulmonary nodule: Secondary | ICD-10-CM

## 2021-04-25 ENCOUNTER — Other Ambulatory Visit: Payer: Self-pay | Admitting: Orthopedic Surgery

## 2021-04-25 DIAGNOSIS — M25511 Pain in right shoulder: Secondary | ICD-10-CM

## 2021-05-02 ENCOUNTER — Other Ambulatory Visit: Payer: Self-pay | Admitting: Internal Medicine

## 2021-05-15 ENCOUNTER — Other Ambulatory Visit: Payer: Self-pay

## 2021-05-15 ENCOUNTER — Ambulatory Visit
Admission: RE | Admit: 2021-05-15 | Discharge: 2021-05-15 | Disposition: A | Discharge status: Home / Self Care | Payer: 59 | Source: Ambulatory Visit | Attending: Orthopedic Surgery | Admitting: Orthopedic Surgery

## 2021-05-15 DIAGNOSIS — M25511 Pain in right shoulder: Secondary | ICD-10-CM

## 2021-09-16 NOTE — Progress Notes (Unsigned)
Cardiology Office Note   Date:  09/16/2021   ID:  Donald Hoover, DOB 07-04-1966, MRN [id number]  PCP:  Garlan Fillers, MD  Cardiologist:   Dietrich Pates, MD    F/U of hyperlipidemia  And HTN     History of Present Illness: Donald Hoover is a 56 y.o. male with hx of HTN and HL   Also with hx of minimal coronary calcifications of CT scan   18  Focal in proximal RCA   The pt had a profound dyslipidemia  With triglycerides over 700  Ate a lot of carbs   Complained of achiness on statin   Started on Repatha   I saw the pt in 2020   Since seen he has done OK   Breathing is OK   No CP    Cut back on carbs but diet is not always optimal due to work/travel  I saw the pt in Jan 2022   No outpatient medications have been marked as taking for the 09/17/21 encounter (Appointment) with Pricilla Riffle, MD.     Allergies:   Patient has no known allergies.   Past Medical History:  Diagnosis Date   DVT (deep venous thrombosis) (HCC) ~ 2002   left calf  - following traumatic leg injury   GERD (gastroesophageal reflux disease)    GERD (gastroesophageal reflux disease)    Headache    Hypercholesteremia    Hypertension    Kidney stones    OSA on CPAP     Past Surgical History:  Procedure Laterality Date   ANTERIOR CERVICAL DECOMP/DISCECTOMY FUSION  06/2007   Hattie Perch 11/09/2010   BACK SURGERY     BALLOON DILATION  12/23/2011   Procedure: BALLOON DILATION;  Surgeon: Willis Modena, MD;  Location: [facility] ENDOSCOPY;  Service: Endoscopy;  Laterality: N/A;   COLONOSCOPY     COLONOSCOPY WITH PROPOFOL N/A 07/05/2015   Procedure: COLONOSCOPY WITH PROPOFOL;  Surgeon: Willis Modena, MD;  Location: [facility] ENDOSCOPY;  Service: Endoscopy;  Laterality: N/A;   CYSTOSCOPY/RETROGRADE/URETEROSCOPY/STONE EXTRACTION WITH BASKET     /notes 11/20/2010   ESOPHAGOGASTRODUODENOSCOPY  12/23/2011   Procedure: ESOPHAGOGASTRODUODENOSCOPY (EGD);  Surgeon: Willis Modena, MD;  Location: [facility] ENDOSCOPY;  Service:  Endoscopy;  Laterality: N/A;   SHOULDER ARTHROSCOPY Left 09/2005   Hattie Perch 11/20/2010     Social History:  The patient  reports that he has quit smoking. He has never used smokeless tobacco. He reports current alcohol use of about 6.0 standard drinks per week. He reports that he does not use drugs.   Family History:  The patient's family history includes Colon polyps in his father; Lung cancer in his father and mother.    ROS:  Please see the history of present illness. All other systems are reviewed and  Negative to the above problem except as noted.    PHYSICAL EXAM: VS:  There were no vitals taken for this visit.  GEN: Morbidly obese 56 yo  in no acute distress  HEENT: normal  Neck: no JVD, carotid bruits, Cardiac: RRR; no murmurs  No LE edema  Respiratory:  clear to auscultation bilaterally,  GI: soft, nontender, nondistended, + BS  No hepatomegaly  MS: no deformity Moving all extremities   Skin: warm and dry, no rash Neuro:  Strength and sensation are intact Psych: euthymic mood, full affect   EKG:  EKG is not ordered today   Lipid Panel    Component Value Date/Time   CHOL 175 07/28/2020  1115   TRIG 306 (H) 07/28/2020 1115   HDL 42 07/28/2020 1115   CHOLHDL 4.2 07/28/2020 1115   CHOLHDL 5.1 09/08/2014 0450   VLDL 65 (H) 09/08/2014 0450   LDLCALC 83 07/28/2020 1115      Wt Readings from Last 3 Encounters:  11/19/20 250 lb (113.4 kg)  07/28/20 278 lb 12.8 oz (126.5 kg)  05/21/19 270 lb (122.5 kg)      ASSESSMENT AND PLAN:  Coronary artery disease.  The patient has evidence of mild calcification with a focal site in the proximal RCA. No symtpoms of angina   Follow  2  HL  Marked improvement in lipids on Repatha with LDL 49, HDL 42   Trig were 179   (may 2021) Discussed diet  Continue to work on wt  2  Hypertension good control.  3  Morbid obesity discussed weight loss.    Current medicines are reviewed at length with the patient today.  The patient does  not have concerns regarding medicines.  Signed, Dietrich Pates, MD  09/16/2021 10:35 PM    [facility] [address] [place], [place], [place]  [address] Phone: [phone]; Fax: [phone]

## 2021-09-17 ENCOUNTER — Encounter: Payer: Self-pay | Admitting: Internal Medicine

## 2021-09-17 ENCOUNTER — Other Ambulatory Visit: Payer: Self-pay

## 2021-09-17 ENCOUNTER — Ambulatory Visit (INDEPENDENT_AMBULATORY_CARE_PROVIDER_SITE_OTHER): Payer: 59 | Admitting: Internal Medicine

## 2021-09-17 VITALS — BP 126/84 | HR 85 | Ht 72.0 in | Wt 263.0 lb

## 2021-09-17 DIAGNOSIS — E782 Mixed hyperlipidemia: Secondary | ICD-10-CM | POA: Diagnosis not present

## 2021-09-17 DIAGNOSIS — Z79899 Other long term (current) drug therapy: Secondary | ICD-10-CM

## 2021-09-17 DIAGNOSIS — I2510 Atherosclerotic heart disease of native coronary artery without angina pectoris: Secondary | ICD-10-CM

## 2021-09-17 NOTE — Patient Instructions (Signed)
Medication Instructions:  ?Your physician recommends that you continue on your current medications as directed. Please refer to the Current Medication list given to you today. ? ?*If you need a refill on your cardiac medications before your next appointment, please call your pharmacy* ? ? ?Lab Work: ?CBC, BMET, NMR ?If you have labs (blood work) drawn today and your tests are completely normal, you will receive your results only by: ?MyChart Message (if you have MyChart) OR ?A paper copy in the mail ?If you have any lab test that is abnormal or we need to change your treatment, we will call you to review the results. ? ? ?Testing/Procedures: ?NONE ? ? ?Follow-Up: ?At [facility], you and your health needs are our priority.  As part of our continuing mission to provide you with exceptional heart care, we have created designated Provider Care Teams.  These Care Teams include your primary Cardiologist (physician) and Advanced Practice Providers (APPs -  Physician Assistants and Nurse Practitioners) who all work together to provide you with the care you need, when you need it. ? ?We recommend signing up for the patient portal called "MyChart".  Sign up information is provided on this After Visit Summary.  MyChart is used to connect with patients for Virtual Visits (Telemedicine).  Patients are able to view lab/test results, encounter notes, upcoming appointments, etc.  Non-urgent messages can be sent to your provider as well.   ?To learn more about what you can do with MyChart, go to [email or web address].   ? ?Your next appointment:   ?1 year(s) ? ?The format for your next appointment:   ?In Person ? ?Provider:   ?Dietrich Pates, MD   ? ? ?Other Instructions ?  ?

## 2021-09-18 LAB — CBC
Hematocrit: 44 % (ref 37.5–51.0)
Hemoglobin: 14.5 g/dL (ref 13.0–17.7)
MCH: 30 pg (ref 26.6–33.0)
MCHC: 33 g/dL (ref 31.5–35.7)
MCV: 91 fL (ref 79–97)
Platelets: 219 10*3/uL (ref 150–450)
RBC: 4.83 x10E6/uL (ref 4.14–5.80)
RDW: 13.1 % (ref 11.6–15.4)
WBC: 6.4 10*3/uL (ref 3.4–10.8)

## 2021-09-18 LAB — NMR, LIPOPROFILE
Cholesterol, Total: 176 mg/dL (ref 100–199)
HDL Particle Number: 43 umol/L (ref >=30.5)
HDL-C: 43 mg/dL (ref >39)
LDL Particle Number: 1082 nmol/L — ABNORMAL HIGH (ref <1000)
LDL Size: 19.7 nm — ABNORMAL LOW (ref >20.5)
LDL-C (NIH Calc): 86 mg/dL (ref 0–99)
LP-IR Score: 65 — ABNORMAL HIGH (ref <=45)
Small LDL Particle Number: 873 nmol/L — ABNORMAL HIGH (ref <=527)
Triglycerides: 286 mg/dL — ABNORMAL HIGH (ref 0–149)

## 2021-09-18 LAB — BASIC METABOLIC PANEL
BUN/Creatinine Ratio: 10 (ref 9–20)
BUN: 10 mg/dL (ref 6–24)
CO2: 21 mmol/L (ref 20–29)
Calcium: 9.6 mg/dL (ref 8.7–10.2)
Chloride: 106 mmol/L (ref 96–106)
Creatinine, Ser: 0.98 mg/dL (ref 0.76–1.27)
Glucose: 100 mg/dL — ABNORMAL HIGH (ref 70–99)
Potassium: 4.6 mmol/L (ref 3.5–5.2)
Sodium: 142 mmol/L (ref 134–144)
eGFR: 91 mL/min/{1.73_m2} (ref >59)

## 2021-10-29 ENCOUNTER — Ambulatory Visit: Payer: 59 | Admitting: Internal Medicine
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
## 2022-03-25 ENCOUNTER — Encounter: Payer: Self-pay | Admitting: Nurse Practitioner

## 2022-03-25 ENCOUNTER — Ambulatory Visit (INDEPENDENT_AMBULATORY_CARE_PROVIDER_SITE_OTHER): Payer: 59 | Admitting: Nurse Practitioner

## 2022-03-25 VITALS — BP 128/80 | HR 76 | Ht 72.0 in | Wt 270.0 lb

## 2022-03-25 DIAGNOSIS — E669 Obesity, unspecified: Secondary | ICD-10-CM | POA: Diagnosis not present

## 2022-03-25 DIAGNOSIS — G4733 Obstructive sleep apnea (adult) (pediatric): Secondary | ICD-10-CM

## 2022-03-25 DIAGNOSIS — Z9989 Dependence on other enabling machines and devices: Secondary | ICD-10-CM

## 2022-03-25 NOTE — Assessment & Plan Note (Signed)
BMI 36. Healthy weight loss encouraged.  

## 2022-03-25 NOTE — Assessment & Plan Note (Addendum)
Excellent compliance per his history. Wears his machine nightly without fail and receives good benefit from use. We will send for new machine - CPAP 13 cmH2O, medium full facemask and heated humidification.   Patient Instructions  Continue to use CPAP every night, minimum of 4-6 hours a night.  Change equipment every 30 days or as directed by DME. Wash your tubing with warm soap and water daily, hang to dry. Wash humidifier portion weekly.  Be aware of reduced alertness and do not drive or operate heavy machinery if experiencing this or drowsiness.  Exercise encouraged, as tolerated. Avoid or decrease alcohol consumption and medications that make you more sleepy, if possible.  Follow up within 90 days of getting your new CPAP machine with Katie Alexus Galka,NP or sooner if needed

## 2022-03-25 NOTE — Progress Notes (Signed)
@Patient  ID: , male    DOB: 1966-04-30, 56 y.o.   MRN: [id number]  Chief Complaint  Patient presents with   Consult    Needing new sleep study for new machine    Referring provider: [id number], MD  HPI: 56 year old male, former remote smoker referred for sleep consult. Past medical history significant for OSA on CPAP, HTN, GERD, HLD.  TEST/EVENTS:  12/20/2011 NPSG: AHI 28.8/hr, body weight 225 lb 02/16/2012 CPAP titration successful titration to PAP setting 13 cmH2o with residual AHI 1.8/hr with medium/large full face mask  03/25/2022: Today - sleep consult Patient presents today for sleep consult referred by Dr. 03/27/2022. He has a history of OSA and has been on CPAP since 2013. Previously followed by neurology.  Reviewed their last note from 2019 he which showed that CPAP is set at 13 cmH2O and he has a medium full facemask.  Has not had any changes since and has the same machine.  His machine is around 56 years old and he would like to be able to get a new one.  He has excellent compliance and wears his CPAP nightly. Never misses a night. He feels well rested in the mornings. Sleeps well throughout the night. Denies any snoring, morning headaches, drowsy driving, sleep parasomnias/paralysis, excessive daytime fatigue.  He goes to bed between 9 to 10 PM.  Falls asleep within 10 minutes.  Does not wake up throughout the night.  Officially gets out of bed morning around 7:30 AM.  Weight has been stable over the last few years.  He does not take anything to help him fall asleep at night.   Past history of hypertension, well controlled on antihypertensives.  No history of stroke.  He is married and lives at home with his wife.  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  Does not operate any heavy machinery.  He is a former remote smoker.  Reports that he has maybe smoked a pack of cigarettes his entire life.  He drinks a sixpack of beer a week.   Family history of emphysema, asthma, heart disease, cancer.  Ep[place] 1   No Known Allergies  Immunization History  Administered Date(s) Administered   Influenza,inj,Quad PF,6+ Mos 03/29/2018, 04/13/2019   Influenza-Unspecified 04/07/2014    Past Medical History:  Diagnosis Date   DVT (deep venous thrombosis) (HCC) ~ 2002   left calf  - following traumatic leg injury   GERD (gastroesophageal reflux disease)    GERD (gastroesophageal reflux disease)    Headache    Hypercholesteremia    Hypertension    Kidney stones    OSA on CPAP     Tobacco History: Social History   Tobacco Use  Smoking Status Former  Smokeless Tobacco Never  Tobacco Comments   "I might have smoked a pack of cigarettes in my life"   Counseling given: Not Answered Tobacco comments: "I might have smoked a pack of cigarettes in my life"   Outpatient Medications Prior to Visit  Medication Sig Dispense Refill   aspirin EC 81 MG tablet Take 162 mg by mouth daily.     cyclobenzaprine (FLEXERIL) 10 MG tablet Take 10 mg by mouth 3 (three) times daily as needed for muscle spasms.     diltiazem (CARDIZEM CD) 180 MG 24 hr capsule Take 180 mg by mouth daily.     Multiple Vitamins-Minerals (MULTIVITAMIN WITH MINERALS) tablet Take 1 tablet by mouth daily.  olmesartan (BENICAR) 20 MG tablet Take 20 mg by mouth at bedtime.      REPATHA SURECLICK 885 MG/ML SOAJ INJECT 1 PEN INTO THE SKIN EVERY 14 DAYS. 2 mL 11   diazepam (VALIUM) 5 MG tablet Take 1 tablet (5 mg total) by mouth 2 (two) times daily. 10 tablet 0   fish oil-omega-3 fatty acids 1000 MG capsule Take 2 g by mouth 2 (two) times daily.      GLUCOSAMINE PO Take 2 tablets by mouth daily.     HYDROcodone-acetaminophen (NORCO/VICODIN) 5-325 MG tablet Take 1 tablet by mouth every 4 (four) hours as needed. (Patient not taking: Reported on 09/17/2021) 10 tablet 0   No facility-administered medications prior to visit.     Review of Systems:   Constitutional:  No weight loss or gain, night sweats, fevers, chills, fatigue, or lassitude. HEENT: No headaches, difficulty swallowing, tooth/dental problems, or sore throat. No sneezing, itching, ear ache, nasal congestion, or post nasal drip CV:  No chest pain, orthopnea, PND, swelling in lower extremities, anasarca, dizziness, palpitations, syncope Resp: No shortness of breath with exertion or at rest. No excess mucus or change in color of mucus. No productive or non-productive. No hemoptysis. No wheezing.  No chest wall deformity GI: +occasional heartburn, indigestion. No abdominal pain, nausea, vomiting, diarrhea, change in bowel habits, loss of appetite, bloody stools.  GU: No dysuria, change in color of urine, urgency or frequency.  No flank pain, no hematuria  Skin: No rash, lesions, ulcerations MSK:  +occasional joint stiffness. No joint swelling.  No decreased range of motion.  No back pain. Neuro: No dizziness or lightheadedness.  Psych: No depression or anxiety. Mood stable.     Physical Exam:  BP 128/80 (BP Location: Left Arm, Cuff Size: Normal)   Pulse 76   Ht 6' (1.829 m)   Wt 270 lb (122.5 kg)   SpO2 99%   BMI 36.62 kg/m   GEN: Pleasant, interactive, well-appearing; obese; in no acute distress. HEENT:  Normocephalic and atraumatic. PERRLA. Sclera white. Nasal turbinates pink, moist and patent bilaterally. No rhinorrhea present. Oropharynx pink and moist, without exudate or edema. No lesions, ulcerations, or postnasal drip. Mallampati II NECK:  Supple w/ fair ROM. No JVD present. Normal carotid impulses w/o bruits. Thyroid symmetrical with no goiter or nodules palpated. No lymphadenopathy.   CV: RRR, no m/r/g, no peripheral edema. Pulses intact, +2 bilaterally. No cyanosis, pallor or clubbing. PULMONARY:  Unlabored, regular breathing. Clear bilaterally A&P w/o wheezes/rales/rhonchi. No accessory muscle use. No dullness to percussion. GI: BS present and normoactive. Soft, non-tender to  palpation. No organomegaly or masses detected. MSK: No erythema, warmth or tenderness. No deformities or joint swelling noted.  Neuro: A/Ox3. No focal deficits noted.   Skin: Warm, no lesions or rashe Psych: Normal affect and behavior. Judgement and thought content appropriate.     Lab Results:  CBC    Component Value Date/Time   WBC 6.4 09/17/2021 1531   WBC 6.2 09/07/2014 1315   RBC 4.83 09/17/2021 1531   RBC 4.48 09/07/2014 1315   HGB 14.5 09/17/2021 1531   HCT 44.0 09/17/2021 1531   PLT 219 09/17/2021 1531   MCV 91 09/17/2021 1531   MCH 30.0 09/17/2021 1531   MCH 29.2 09/07/2014 1315   MCHC 33.0 09/17/2021 1531   MCHC 33.8 09/07/2014 1315   RDW 13.1 09/17/2021 1531    BMET    Component Value Date/Time   NA 142 09/17/2021 1531   K 4.6  09/17/2021 1531   CL 106 09/17/2021 1531   CO2 21 09/17/2021 1531   GLUCOSE 100 (H) 09/17/2021 1531   GLUCOSE 95 09/08/2014 0450   BUN 10 09/17/2021 1531   CREATININE 0.98 09/17/2021 1531   CALCIUM 9.6 09/17/2021 1531   GFRNONAA 77 (L) 09/08/2014 0450   GFRAA 89 (L) 09/08/2014 0450    BNP No results found for: "BNP"   Imaging:  No results found.        No data to display          No results found for: "NITRICOXIDE"      Assessment & Plan:   OSA on CPAP Excellent compliance per his history. Wears his machine nightly without fail and receives good benefit from use. We will send for new machine - CPAP 13 cmH2O, medium full facemask and heated humidification.   Patient Instructions  Continue to use CPAP every night, minimum of 4-6 hours a night.  Change equipment every 30 days or as directed by DME. Wash your tubing with warm soap and water daily, hang to dry. Wash humidifier portion weekly.  Be aware of reduced alertness and do not drive or operate heavy machinery if experiencing this or drowsiness.  Exercise encouraged, as tolerated. Avoid or decrease alcohol consumption and medications that make you more  sleepy, if possible.  Follow up within 90 days of getting your new CPAP machine with Katie Dewie Ahart,NP or sooner if needed     Obesity (BMI 30-39.9) BMI 36. Healthy weight loss encouraged.    I spent 35 minutes of dedicated to the care of this patient on the date of this encounter to include pre-visit review of records, face-to-face time with the patient discussing conditions above, post visit ordering of testing, clinical documentation with the electronic health record, making appropriate referrals as documented, and communicating necessary findings to members of the patients care team.  Noemi Chapel, NP 03/25/2022  Pt aware and understands NP's role.

## 2022-03-25 NOTE — Patient Instructions (Addendum)
Continue to use CPAP every night, minimum of 4-6 hours a night.  Change equipment every 30 days or as directed by DME. Wash your tubing with warm soap and water daily, hang to dry. Wash humidifier portion weekly.  Be aware of reduced alertness and do not drive or operate heavy machinery if experiencing this or drowsiness.  Exercise encouraged, as tolerated. Avoid or decrease alcohol consumption and medications that make you more sleepy, if possible.  Follow up within 90 days of getting your new CPAP machine with Katie Paiten Boies,NP or sooner if needed

## 2022-03-25 NOTE — Progress Notes (Signed)
Reviewed and agree with assessment/plan.   Riot Barrick, MD [facility] 03/25/2022, 10:41 PM Pager:  [phone]  

## 2022-04-08 ENCOUNTER — Other Ambulatory Visit: Payer: Self-pay | Admitting: Internal Medicine

## 2022-04-08 DIAGNOSIS — E782 Mixed hyperlipidemia: Secondary | ICD-10-CM

## 2022-04-08 DIAGNOSIS — I2510 Atherosclerotic heart disease of native coronary artery without angina pectoris: Secondary | ICD-10-CM

## 2022-07-05 ENCOUNTER — Ambulatory Visit
Admission: EM | Admit: 2022-07-05 | Discharge: 2022-07-05 | Disposition: A | Discharge status: Home / Self Care | Payer: 59 | Attending: Internal Medicine | Admitting: Internal Medicine

## 2022-07-05 ENCOUNTER — Ambulatory Visit (INDEPENDENT_AMBULATORY_CARE_PROVIDER_SITE_OTHER): Payer: 59

## 2022-07-05 DIAGNOSIS — R051 Acute cough: Secondary | ICD-10-CM | POA: Diagnosis not present

## 2022-07-05 DIAGNOSIS — J069 Acute upper respiratory infection, unspecified: Secondary | ICD-10-CM | POA: Diagnosis not present

## 2022-07-05 DIAGNOSIS — R000 Tachycardia, unspecified: Secondary | ICD-10-CM

## 2022-07-05 DIAGNOSIS — R059 Cough, unspecified: Secondary | ICD-10-CM | POA: Diagnosis not present

## 2022-07-05 DIAGNOSIS — R509 Fever, unspecified: Secondary | ICD-10-CM

## 2022-07-05 MED ORDER — ACETAMINOPHEN 325 MG PO TABS
650.0000 mg | ORAL_TABLET | Freq: Once | ORAL | Status: AC
  Administered 2022-07-05: 650 mg via ORAL

## 2022-07-05 MED ORDER — PREDNISONE 20 MG PO TABS: 40.0000 mg | ORAL_TABLET | Freq: Every day | ORAL | 0 refills | Status: AC

## 2022-07-05 MED ORDER — AMOXICILLIN-POT CLAVULANATE 875-125 MG PO TABS: 1.0000 | ORAL_TABLET | Freq: Two times a day (BID) | ORAL | 0 refills | Status: DC

## 2022-07-05 MED ORDER — BENZONATATE 100 MG PO CAPS: 100.0000 mg | ORAL_CAPSULE | Freq: Three times a day (TID) | ORAL | 0 refills | Status: DC | PRN

## 2022-07-05 NOTE — Discharge Instructions (Signed)
Chest X-ray was normal.  I have prescribed you 3 medications including an antibiotic and a steroid to help alleviate upper respiratory infection.  Take these medications with food.  Please monitor heart rate closely and ensure adequate fluid hydration.  Please monitor fever closely as well.  Follow-up with the ER if any symptoms persist or worsen.

## 2022-07-05 NOTE — ED Triage Notes (Addendum)
Pt presents to uc with co of cough, ha, and fever for 2 weeks. Pt reports coughing over cpap overnight. Pt reports tylenol for symptoms. Pt reports at home covid neg

## 2022-07-05 NOTE — ED Provider Notes (Signed)
[facility]    CSN: [id number] Arrival date & time: 07/05/22  0831      History   Chief Complaint Chief Complaint  Patient presents with   Cough   Fever    HPI [facility] is a 56 y.o. male.   Patient presents with a 2-week period of cough, nasal congestion, fever.  Tmax at home was 103.  He reports that his fevers have been present in the morning.  He has taken Tylenol for symptoms.  Last dose of Tylenol was last night.  He denies any formal diagnosis of asthma or COPD and does not smoke cigarettes.  He does use a CPAP machine but reports he has not been able to wear recently given excessive coughing at night.  Denies chest pain, shortness of breath, sore throat, ear pain, nausea, vomiting, diarrhea, abdominal pain.  Denies any obvious known sick contacts.   Cough Fever   Past Medical History:  Diagnosis Date   DVT (deep venous thrombosis) (HCC) ~ 2002   left calf  - following traumatic leg injury   GERD (gastroesophageal reflux disease)    GERD (gastroesophageal reflux disease)    Headache    Hypercholesteremia    Hypertension    Kidney stones    OSA on CPAP     Patient Active Problem List   Diagnosis Date Noted   OSA on CPAP 03/25/2022   Obesity (BMI 30-39.9) 03/25/2022   Chest pain 09/07/2014    Past Surgical History:  Procedure Laterality Date   ANTERIOR CERVICAL DECOMP/DISCECTOMY FUSION  06/2007   Hattie Perch 11/09/2010   BACK SURGERY     BALLOON DILATION  12/23/2011   Procedure: BALLOON DILATION;  Surgeon: Willis Modena, MD;  Location: [facility];  Service: Endoscopy;  Laterality: N/A;   COLONOSCOPY     COLONOSCOPY WITH PROPOFOL N/A 07/05/2015   Procedure: COLONOSCOPY WITH PROPOFOL;  Surgeon: Willis Modena, MD;  Location: [facility] ENDOSCOPY;  Service: Endoscopy;  Laterality: N/A;   CYSTOSCOPY/RETROGRADE/URETEROSCOPY/STONE EXTRACTION WITH BASKET     /notes 11/20/2010   ESOPHAGOGASTRODUODENOSCOPY  12/23/2011   Procedure:  ESOPHAGOGASTRODUODENOSCOPY (EGD);  Surgeon: Willis Modena, MD;  Location: [facility];  Service: Endoscopy;  Laterality: N/A;   SHOULDER ARTHROSCOPY Left 09/2005   Hattie Perch 11/20/2010       Home Medications    Prior to Admission medications   Medication Sig Start Date End Date Taking? Authorizing Provider  amoxicillin-clavulanate (AUGMENTIN) 875-125 MG tablet Take 1 tablet by mouth every 12 (twelve) hours. 07/05/22  Yes Lindalou Soltis, Rolly Salter E, FNP  benzonatate (TESSALON) 100 MG capsule Take 1 capsule (100 mg total) by mouth every 8 (eight) hours as needed for cough. 07/05/22  Yes Machael Raine, Rolly Salter E, FNP  predniSONE (DELTASONE) 20 MG tablet Take 2 tablets (40 mg total) by mouth daily for 5 days. 07/05/22 07/10/22 Yes Gustavus Bryant, FNP  aspirin EC 81 MG tablet Take 162 mg by mouth daily.    [provider]  cyclobenzaprine (FLEXERIL) 10 MG tablet Take 10 mg by mouth 3 (three) times daily as needed for muscle spasms.    [provider]  diazepam (VALIUM) 5 MG tablet Take 1 tablet (5 mg total) by mouth 2 (two) times daily. 11/19/20   Jacalyn Lefevre, MD  diltiazem (CARDIZEM CD) 180 MG 24 hr capsule Take 180 mg by mouth daily. 07/26/20   [provider]  Evolocumab (REPATHA SURECLICK) 140 MG/ML SOAJ INJECT 1 PEN INTO THE SKIN EVERY 14 DAYS 04/09/22   Pricilla Riffle,  MD  fish oil-omega-3 fatty acids 1000 MG capsule Take 2 g by mouth 2 (two) times daily.     [provider]  GLUCOSAMINE PO Take 2 tablets by mouth daily.    [provider]  HYDROcodone-acetaminophen (NORCO/VICODIN) 5-325 MG tablet Take 1 tablet by mouth every 4 (four) hours as needed. Patient not taking: Reported on 09/17/2021 11/19/20   Jacalyn LefevreHaviland, Julie, MD  Multiple Vitamins-Minerals (MULTIVITAMIN WITH MINERALS) tablet Take 1 tablet by mouth daily.    [provider]  olmesartan (BENICAR) 20 MG tablet Take 20 mg by mouth at bedtime.     [provider]    Family History Family History   Problem Relation Age of Onset   Colon polyps Father    Lung cancer Father    Lung cancer Mother     Social History Social History   Tobacco Use   Smoking status: Former   Smokeless tobacco: Never   Tobacco comments:    "I might have smoked a pack of cigarettes in my life"  Vaping Use   Vaping Use: Never used  Substance Use Topics   Alcohol use: Yes    Alcohol/week: 6.0 standard drinks of alcohol    Types: 6 Cans of beer per week   Drug use: No     Allergies   Patient has no known allergies.   Review of Systems Review of Systems Per HPI  Physical Exam Triage Vital Signs ED Triage Vitals  Enc Vitals Group     BP 07/05/22 0950 133/77     Pulse Rate 07/05/22 0950 (!) 123     Resp 07/05/22 0950 19     Temp 07/05/22 0950 99.9 F (37.7 C)     Temp src --      SpO2 07/05/22 0950 96 %     Weight --      Height --      Head Circumference --      Peak Flow --      Pain Score 07/05/22 0949 6     Pain Loc --      Pain Edu? --      Excl. in GC? --    No data found.  Updated Vital Signs BP 133/77   Pulse (!) 110   Temp 100 F (37.8 C)   Resp 19   SpO2 96%   Visual Acuity Right Eye Distance:   Left Eye Distance:   Bilateral Distance:    Right Eye Near:   Left Eye Near:    Bilateral Near:     Physical Exam Constitutional:      General: He is not in acute distress.    Appearance: Normal appearance. He is not toxic-appearing or diaphoretic.  HENT:     Head: Normocephalic and atraumatic.     Right Ear: Tympanic membrane and ear canal normal.     Left Ear: Tympanic membrane and ear canal normal.     Nose: Congestion present.     Mouth/Throat:     Mouth: Mucous membranes are moist.     Pharynx: No posterior oropharyngeal erythema.  Eyes:     Extraocular Movements: Extraocular movements intact.     Conjunctiva/sclera: Conjunctivae normal.     Pupils: Pupils are equal, round, and reactive to light.  Cardiovascular:     Rate and Rhythm: Regular rhythm.  Tachycardia present.     Pulses: Normal pulses.     Heart sounds: Normal heart sounds.  Pulmonary:     Effort:  Pulmonary effort is normal. No respiratory distress.     Breath sounds: Normal breath sounds. No stridor. No wheezing, rhonchi or rales.  Abdominal:     General: Abdomen is flat. Bowel sounds are normal.     Palpations: Abdomen is soft.  Musculoskeletal:        General: Normal range of motion.     Cervical back: Normal range of motion.  Skin:    General: Skin is warm and dry.  Neurological:     General: No focal deficit present.     Mental Status: He is alert and oriented to person, place, and time. Mental status is at baseline.  Psychiatric:        Mood and Affect: Mood normal.        Behavior: Behavior normal.      UC Treatments / Results  Labs (all labs ordered are listed, but only abnormal results are displayed) Labs Reviewed - No data to display  EKG   Radiology DG Chest 2 View  Result Date: 07/05/2022 CLINICAL DATA:  Pt presents to uc with co of cough, ha, and fever for 2 weeks. Pt reports coughing over cpap overnight. cough EXAM: CHEST - 2 VIEW COMPARISON:  Radiograph 11/07/2014, CT 11/22/2020 FINDINGS: Normal mediastinum and cardiac silhouette. Normal pulmonary vasculature. No evidence of effusion, infiltrate, or pneumothorax. No acute bony abnormality. Anterior cervical fusion.  Degenerative osteophytosis of the spine. IMPRESSION: No acute cardiopulmonary process. Electronically Signed   By: Genevive Bi M.D.   On: 07/05/2022 10:32    Procedures Procedures (including critical care time)  Medications Ordered in UC Medications  acetaminophen (TYLENOL) tablet 650 mg (650 mg Oral Given 07/05/22 1007)    Initial Impression / Assessment and Plan / UC Course  I have reviewed the triage vital signs and the nursing notes.  Pertinent labs & imaging results that were available during my care of the patient were reviewed by me and considered in my medical  decision making (see chart for details).     Patient presents with 2-week history of nasal congestion and cough.  Symptoms most likely started off as a viral illness but given duration of symptoms, there is concern for secondary bacterial infection especially given persistent fever.  Chest x-ray completed which was negative for any acute cardiopulmonary process.  Did have some mild tachycardia and low-grade temp.  Tylenol administered with improvement in heart rate but not temp.  EKG was completed to ensure no other worrisome cardiac etiology and showed sinus tachycardia.  No other abnormalities noted on EKG that would need emergent evaluation.  Suspect tachycardia is related to low-grade fever and low p.o. intake as wife at bedside reports that he has not been drinking much water.  Therefore, encouraged increasing oral fluid intake and monitoring fever very closely at home.  Will prescribe antibiotic given duration of symptoms and prednisone to decrease inflammation.  Patient reports he has taken prednisone before and tolerated well.  No obvious contraindications to steroids noted in patient's history.  Tessalon Perles prescribed to take as needed for cough as well.  Fever monitoring and management discussed with patient.  Patient's wife is at bedside and is a Publishing rights manager.  She reports that she has resources at home to evaluate patient symptoms, fever, heart rate.  Encouraged increased monitoring and following up with the ER if symptoms persist or worsen.  Do not think that emergent evaluation is necessary at this time as patient appears stable and heart rate responded to antipyretic.  Do not think viral testing is necessary given duration of symptoms as it would not change treatment.  Patient and wife at bedside verbalized understanding and were agreeable with plan. Final Clinical Impressions(s) / UC Diagnoses   Final diagnoses:  Acute upper respiratory infection  Acute cough     Discharge  Instructions      Chest X-ray was normal.  I have prescribed you 3 medications including an antibiotic and a steroid to help alleviate upper respiratory infection.  Take these medications with food.  Please monitor heart rate closely and ensure adequate fluid hydration.  Please monitor fever closely as well.  Follow-up with the ER if any symptoms persist or worsen.     ED Prescriptions     Medication Sig Dispense Auth. Provider   amoxicillin-clavulanate (AUGMENTIN) 875-125 MG tablet Take 1 tablet by mouth every 12 (twelve) hours. 14 tablet [place]atz, [place] E, [place]   predniSONE (DELTASONE) 20 MG tablet Take 2 tablets (40 mg total) by mouth daily for 5 days. 10 tablet [place], [place] E, [place]   benzonatate (TESSALON) 100 MG capsule Take 1 capsule (100 mg total) by mouth every 8 (eight) hours as needed for cough. 21 capsule Erin, Acie Fredrickson, [place]      PDMP not reviewed this encounter.   Gustavus Bryant, [place] 07/05/22 1202

## 2022-08-19 ENCOUNTER — Other Ambulatory Visit: Payer: Self-pay | Admitting: Podiatry

## 2022-08-19 ENCOUNTER — Encounter: Payer: Self-pay | Admitting: Podiatry

## 2022-08-19 ENCOUNTER — Ambulatory Visit (INDEPENDENT_AMBULATORY_CARE_PROVIDER_SITE_OTHER): Payer: 59 | Admitting: Podiatry

## 2022-08-19 ENCOUNTER — Ambulatory Visit (INDEPENDENT_AMBULATORY_CARE_PROVIDER_SITE_OTHER): Payer: 59

## 2022-08-19 VITALS — BP 147/84 | HR 81 | Temp 98.6°F

## 2022-08-19 DIAGNOSIS — M722 Plantar fascial fibromatosis: Secondary | ICD-10-CM

## 2022-08-19 DIAGNOSIS — R52 Pain, unspecified: Secondary | ICD-10-CM

## 2022-08-19 MED ORDER — TRIAMCINOLONE ACETONIDE 10 MG/ML IJ SUSP
20.0000 mg | Freq: Once | INTRAMUSCULAR | Status: AC
  Administered 2022-08-19: 20 mg

## 2022-08-19 MED ORDER — DICLOFENAC SODIUM 75 MG PO TBEC: 75.0000 mg | DELAYED_RELEASE_TABLET | Freq: Two times a day (BID) | ORAL | 2 refills | Status: AC

## 2022-08-19 NOTE — Progress Notes (Signed)
Patient presents with subjective:   Patient ID: [facility], male   DOB: 57 y.o.   MRN: [id number]   HPI Severe pain in the heel times approximate 4 to 5 months.  States he went to [employer] and walked a lot and it started after that with the right slightly worse than the left but both of them being very tender.  States that he has tried gel inserts Epsom salt soaks and ice bottle.  Patient does not smoke tries to be active   Review of Systems  All other systems reviewed and are negative.       Objective:  Physical Exam Vitals and nursing note reviewed.  Constitutional:      Appearance: He is well-developed.  Pulmonary:     Effort: Pulmonary effort is normal.  Musculoskeletal:        General: Normal range of motion.  Skin:    General: Skin is warm.  Neurological:     Mental Status: He is alert.     Neurovascular status intact muscle strength adequate range of motion adequate with exquisite discomfort noted medial fascial band both feet with fluid buildup around the insertion of the tendon into the calcaneus.  Moderate depression of the arch good digital perfusion well-oriented x 3     Assessment:  Acute Planter fasciitis of the heel at insertion bilateral with inflammation noted to the medial central band with moderate depression of the arch with valgus of planus deformity     Plan:  H&P reviewed and discussed in today sterile prep and injected the plantar fascia at insertion 3 mg Kenalog 5 mg Xylocaine bilateral applied fascial brace bilateral and placed patient on oral Mobic at the current time.  Reappoint for [place] to recheck again in the next 10 days may require orthotics or other treatment  X-rays indicate large plantar spur formation bilateral no indication stress fracture arthritis

## 2022-08-19 NOTE — Patient Instructions (Signed)

## 2022-08-30 ENCOUNTER — Ambulatory Visit (INDEPENDENT_AMBULATORY_CARE_PROVIDER_SITE_OTHER): Payer: 59 | Admitting: Podiatry

## 2022-08-30 ENCOUNTER — Encounter: Payer: Self-pay | Admitting: Podiatry

## 2022-08-30 DIAGNOSIS — M722 Plantar fascial fibromatosis: Secondary | ICD-10-CM | POA: Diagnosis not present

## 2022-08-31 NOTE — Progress Notes (Signed)
Subjective:   Patient ID: [facility], male   DOB: 57 y.o.   MRN: [id number]   HPI Patient presents stating that he is doing much better and having a significant reduction of his discomfort   ROS      Objective:  Physical Exam  Neurovascular status intact muscle strength adequate patient is found to have heel pain bilateral that has improved dramatically with treatment process     Assessment:  Inflammatory fasciitis bilateral significant improvement     Plan:  Reviewed condition and that we may need to make him inserts or do other treatments but at this time he is doing so well we will just get a continue stretching shoe gear modifications and he will be seen back as needed all questions answered today

## 2022-09-20 ENCOUNTER — Other Ambulatory Visit: Payer: Self-pay | Admitting: Internal Medicine

## 2022-09-20 DIAGNOSIS — I2510 Atherosclerotic heart disease of native coronary artery without angina pectoris: Secondary | ICD-10-CM

## 2022-09-20 DIAGNOSIS — E782 Mixed hyperlipidemia: Secondary | ICD-10-CM

## 2022-10-21 ENCOUNTER — Other Ambulatory Visit: Payer: Self-pay | Admitting: Internal Medicine

## 2022-10-21 DIAGNOSIS — R1030 Lower abdominal pain, unspecified: Secondary | ICD-10-CM

## 2022-10-22 ENCOUNTER — Ambulatory Visit
Admission: RE | Admit: 2022-10-22 | Discharge: 2022-10-22 | Disposition: A | Discharge status: Home / Self Care | Payer: 59 | Source: Ambulatory Visit | Attending: Internal Medicine | Admitting: Internal Medicine

## 2022-10-22 DIAGNOSIS — R1030 Lower abdominal pain, unspecified: Secondary | ICD-10-CM

## 2022-10-22 MED ORDER — IOPAMIDOL (ISOVUE-300) INJECTION 61%
100.0000 mL | Freq: Once | INTRAVENOUS | Status: AC | PRN
  Administered 2022-10-22: 100 mL via INTRAVENOUS

## 2022-10-23 ENCOUNTER — Encounter: Payer: Self-pay | Admitting: Podiatry

## 2022-10-23 ENCOUNTER — Ambulatory Visit (INDEPENDENT_AMBULATORY_CARE_PROVIDER_SITE_OTHER): Payer: 59 | Admitting: Podiatry

## 2022-10-23 DIAGNOSIS — M722 Plantar fascial fibromatosis: Secondary | ICD-10-CM | POA: Diagnosis not present

## 2022-10-23 MED ORDER — CELECOXIB 200 MG PO CAPS: 200.0000 mg | ORAL_CAPSULE | Freq: Two times a day (BID) | ORAL | 2 refills | Status: DC

## 2022-10-23 MED ORDER — TRIAMCINOLONE ACETONIDE 10 MG/ML IJ SUSP
20.0000 mg | Freq: Once | INTRAMUSCULAR | Status: AC
  Administered 2022-10-23: 20 mg

## 2022-10-23 NOTE — Progress Notes (Signed)
Subjective:   Patient ID: [facility], male   DOB: 57 y.o.   MRN: [id number]   HPI Patient is getting ready to go out of the country in the next 2 weeks and has reoccurrence of heel pain both feet and does work on cement floors   ROS      Objective:  Physical Exam  Neurovascular status intact exquisite discomfort medial fascial band bilateral with inflammation fluid buildup at the insertional point with moderate flatfoot deformity     Assessment:  Acute Planter fasciitis bilateral with inflammation along with moderate structural changes with patient who is moderately obese and does work on Management consultant     Plan:  Reviewed the conditions organ to start him on Celebrex as the diclofenac was hard for him to tolerate.  I did go ahead today and I injected the plantar fascia bilateral 3 mg Kenalog 5 mg Xylocaine and I went ahead and I casted for functional orthotic devices to reduce all stress on his feet

## 2022-10-29 ENCOUNTER — Telehealth: Payer: Self-pay | Admitting: Podiatry

## 2022-10-29 NOTE — Telephone Encounter (Signed)
Has orthotics came in yet ?

## 2023-06-25 ENCOUNTER — Encounter: Payer: Self-pay | Admitting: Podiatry

## 2023-06-25 ENCOUNTER — Ambulatory Visit (INDEPENDENT_AMBULATORY_CARE_PROVIDER_SITE_OTHER): Payer: 59 | Admitting: Podiatry

## 2023-06-25 VITALS — Ht 72.0 in | Wt 270.0 lb

## 2023-06-25 DIAGNOSIS — M722 Plantar fascial fibromatosis: Secondary | ICD-10-CM | POA: Diagnosis not present

## 2023-06-25 MED ORDER — DICLOFENAC SODIUM 75 MG PO TBEC: 75.0000 mg | DELAYED_RELEASE_TABLET | Freq: Two times a day (BID) | ORAL | 2 refills | Status: DC

## 2023-06-25 MED ORDER — TRIAMCINOLONE ACETONIDE 10 MG/ML IJ SUSP
10.0000 mg | Freq: Once | INTRAMUSCULAR | Status: AC
  Administered 2023-06-25: 10 mg via INTRA_ARTICULAR

## 2023-06-25 NOTE — Progress Notes (Signed)
Subjective:   Patient ID: Donald Hoover, male   DOB: 57 y.o.   MRN: [id number]   HPI Patient states he was real active on his heels and they flared up again and he was doing better over the last 7 months   ROS      Objective:  Physical Exam  Neurovascular status intact with exquisite discomfort in the plantar fascia bilateral with fluid buildup around the joint surfaces     Assessment:  Acute plantar fasciitis bilateral that has just reoccurred     Plan:  H&P reviewed and I am happy with the pattern oriented I went ahead and injected the plantar fascia bilateral 3 mg Kenalog 5 mg Xylocaine and wrote him a new prescription for diclofenac to take as needed

## 2023-08-12 ENCOUNTER — Other Ambulatory Visit: Payer: Self-pay | Admitting: Internal Medicine

## 2023-08-12 DIAGNOSIS — E782 Mixed hyperlipidemia: Secondary | ICD-10-CM

## 2023-08-12 DIAGNOSIS — I2510 Atherosclerotic heart disease of native coronary artery without angina pectoris: Secondary | ICD-10-CM

## 2023-11-25 ENCOUNTER — Other Ambulatory Visit: Payer: Self-pay | Admitting: Podiatry

## 2023-12-15 ENCOUNTER — Ambulatory Visit (INDEPENDENT_AMBULATORY_CARE_PROVIDER_SITE_OTHER): Admitting: Podiatry

## 2023-12-15 ENCOUNTER — Encounter: Payer: Self-pay | Admitting: Podiatry

## 2023-12-15 VITALS — Ht 72.0 in | Wt 270.0 lb

## 2023-12-15 DIAGNOSIS — M722 Plantar fascial fibromatosis: Secondary | ICD-10-CM | POA: Diagnosis not present

## 2023-12-15 MED ORDER — TRIAMCINOLONE ACETONIDE 10 MG/ML IJ SUSP
10.0000 mg | Freq: Once | INTRAMUSCULAR | Status: AC
  Administered 2023-12-15: 10 mg via INTRA_ARTICULAR

## 2023-12-15 NOTE — Progress Notes (Signed)
 Subjective:   Patient ID: Donald Hoover, male   DOB: 58 y.o.   MRN: [id number]   HPI Patient presents with a lot of pain in the heels of both feet and states it just started back in the last 3 weeks   ROS      Objective:  Physical Exam  Neuro vascular status intact inflammation pain of the plantar heel region bilateral     Assessment:  Acute plantar fasciitis bilateral heel at the insertion     Plan:  H&P done sterile prep injected the plantar fascia bilateral 3 mg Kenalog  5 mg Xylocaine applied sterile dressings reappoint as symptoms indicate

## 2024-01-02 ENCOUNTER — Encounter: Payer: Self-pay | Admitting: Internal Medicine

## 2024-03-25 ENCOUNTER — Other Ambulatory Visit: Payer: Self-pay | Admitting: Podiatry

## 2024-04-08 NOTE — Progress Notes (Signed)
 Cardiology Office Note   Date:  04/09/2024   ID:  DAEGON DEISS, DOB Mar 27, 1966, MRN [id number]  PCP:  Donald Toribio MATSU, MD  Cardiologist:   Vina Gull, MD    F/U of HL and HTN     History of Present Illness: Donald Hoover is a 58 y.o. male with hx of HTN and HL  (trig over 700)   Also with hx of minimal coronary calcifications of CT scan  Calcium score in 2020 was  18  Focal calcification  in proximal RCA   Pt was intolerant to statins   Started on Repatha   I saw the pt in 2022    SInce seen he denies CP   Breaghing is OK  No dizziness Works in Therapist, sports to work on diet   The pt was last seen in clinic in 2023  Lipids LDL 86  HDL 43  Trig 286   Small particles 873    Since seen he says he has been doing good   NO CP  No SOB    Owns own business  Yard work   On feet all day  Diet:    Breakfast:  Skip Lunch at [employer] Unsweet tea Dinner   Out to [employer] and veggies  2 vodka tonic and Lemonade     Snacks   Peanuts   Apple    Current Meds  Medication Sig   aspirin  EC 81 MG tablet Take 162 mg by mouth daily.   cyclobenzaprine (FLEXERIL) 10 MG tablet Take 10 mg by mouth 3 (three) times daily as needed for muscle spasms.   diclofenac  (VOLTAREN ) 75 MG EC tablet TAKE 1 TABLET BY MOUTH 2 TIMES DAILY.   diltiazem (CARDIZEM CD) 180 MG 24 hr capsule Take 180 mg by mouth daily.   fish oil-omega-3 fatty acids 1000 MG capsule Take 2 g by mouth 2 (two) times daily.    GLUCOSAMINE PO Take 2 tablets by mouth daily.   Multiple Vitamins-Minerals (MULTIVITAMIN WITH MINERALS) tablet Take 1 tablet by mouth daily.   olmesartan (BENICAR) 20 MG tablet Take 20 mg by mouth at bedtime.    REPATHA  SURECLICK 140 MG/ML SOAJ INJECT 1 PEN INTO THE SKIN EVERY 14 DAYS     Allergies:   Patient has no known allergies.   Past Medical History:  Diagnosis Date   DVT (deep venous thrombosis) (HCC) ~ 2002   left calf  - following traumatic leg  injury   GERD (gastroesophageal reflux disease)    GERD (gastroesophageal reflux disease)    Headache    Hypercholesteremia    Hypertension    Kidney stones    OSA on CPAP     Past Surgical History:  Procedure Laterality Date   ANTERIOR CERVICAL DECOMP/DISCECTOMY FUSION  06/2007   thelbert 11/09/2010   BACK SURGERY     BALLOON DILATION  12/23/2011   Procedure: BALLOON DILATION;  Surgeon: Elsie Cree, MD;  Location: [facility];  Service: Endoscopy;  Laterality: N/A;   COLONOSCOPY     COLONOSCOPY WITH PROPOFOL  N/A 07/05/2015   Procedure: COLONOSCOPY WITH PROPOFOL ;  Surgeon: Elsie Cree, MD;  Location: [facility] ENDOSCOPY;  Service: Endoscopy;  Laterality: N/A;   CYSTOSCOPY/RETROGRADE/URETEROSCOPY/STONE EXTRACTION WITH BASKET     /notes 11/20/2010   ESOPHAGOGASTRODUODENOSCOPY  12/23/2011   Procedure: ESOPHAGOGASTRODUODENOSCOPY (EGD);  Surgeon: Elsie Cree, MD;  Location: [facility];  Service: Endoscopy;  Laterality:  N/A;   SHOULDER ARTHROSCOPY Left 09/2005   thelbert 11/20/2010     Social History:  The patient  reports that he has quit smoking. He has never used smokeless tobacco. He reports current alcohol use of about 6.0 standard drinks of alcohol per week. He reports that he does not use drugs.   Family History:  The patient's family history includes Colon polyps in his father; Lung cancer in his father and mother.    ROS:  Please see the history of present illness. All other systems are reviewed and  Negative to the above problem except as noted.    PHYSICAL EXAM: VS:  BP 112/60   Pulse 67   Ht 6' (1.829 m)   Wt 268 lb 6.4 oz (121.7 kg)   SpO2 97%   BMI 36.40 kg/m   GEN: Obese 58 yo  in no acute distress  HEENT: normal  Neck: no JVD, carotid bruits, Cardiac: RRR; no murmur    Respiratory:  clear to auscultation GI: soft, nontender, no masses No hepatomegaly  Ext  no LE edema     EKG:  EKG is ordered today   NSR 67 bpm     Lipid Panel    Component Value  Date/Time   CHOL 175 07/28/2020 1115   TRIG 306 (H) 07/28/2020 1115   HDL 42 07/28/2020 1115   CHOLHDL 4.2 07/28/2020 1115   CHOLHDL 5.1 09/08/2014 0450   VLDL 65 (H) 09/08/2014 0450   LDLCALC 83 07/28/2020 1115      Wt Readings from Last 3 Encounters:  04/09/24 268 lb 6.4 oz (121.7 kg)  12/15/23 270 lb (122.5 kg)  06/25/23 270 lb (122.5 kg)      ASSESSMENT AND PLAN:  1  CAD   Pt with mild calcifications on CT    Calcium score of 18   Asymptomatic   Risk factor modification    2  HL  Pt on Repatha  Will get lipomed panel, Lpa     2  Hypertension   Well is  controlled on Benicar and Cardiazem    3  Morbid obesity Discussed diet   Minimize carbs    Current medicines are reviewed at length with the patient today.  The patient does not have concerns regarding medicines.  Signed, Vina Gull, MD  04/09/2024 8:45 AM     [facility]

## 2024-04-09 ENCOUNTER — Encounter: Payer: Self-pay | Admitting: Internal Medicine

## 2024-04-09 ENCOUNTER — Ambulatory Visit: Attending: Internal Medicine | Admitting: Internal Medicine

## 2024-04-09 VITALS — BP 112/60 | HR 67 | Ht 72.0 in | Wt 268.4 lb

## 2024-04-09 DIAGNOSIS — I2510 Atherosclerotic heart disease of native coronary artery without angina pectoris: Secondary | ICD-10-CM | POA: Diagnosis not present

## 2024-04-09 DIAGNOSIS — E782 Mixed hyperlipidemia: Secondary | ICD-10-CM | POA: Diagnosis not present

## 2024-04-09 NOTE — Patient Instructions (Signed)
 Medication Instructions:  Your physician recommends that you continue on your current medications as directed. Please refer to the Current Medication list given to you today.  *If you need a refill on your cardiac medications before your next appointment, please call your pharmacy*  Lab Work: TODAY: NMR, Apo B, LP(a), vitamin D If you have labs (blood work) drawn today and your tests are completely normal, you will receive your results only by: MyChart Message (if you have MyChart) OR A paper copy in the mail If you have any lab test that is abnormal or we need to change your treatment, we will call you to review the results.  Follow-Up: At [facility], you and your health needs are our priority.  As part of our continuing mission to provide you with exceptional heart care, our providers are all part of one team.  This team includes your primary Cardiologist (physician) and Advanced Practice Providers or APPs (Physician Assistants and Nurse Practitioners) who all work together to provide you with the care you need, when you need it.  Your next appointment:   1 year(s)  Provider:   Vina Gull, MD   We recommend signing up for the patient portal called MyChart.  Sign up information is provided on this After Visit Summary.  MyChart is used to connect with patients for Virtual Visits (Telemedicine).  Patients are able to view lab/test results, encounter notes, upcoming appointments, etc.  Non-urgent messages can be sent to your provider as well.    To learn more about what you can do with MyChart, go to [email or web address].

## 2024-04-15 ENCOUNTER — Ambulatory Visit: Payer: Self-pay | Admitting: Internal Medicine

## 2024-04-19 ENCOUNTER — Other Ambulatory Visit (HOSPITAL_COMMUNITY): Payer: Self-pay

## 2024-04-19 ENCOUNTER — Telehealth: Payer: Self-pay | Admitting: Pharmacy Technician

## 2024-04-19 LAB — APOLIPOPROTEIN B: Apolipoprotein B: 73 mg/dL (ref <90)

## 2024-04-19 LAB — NMR, LIPOPROFILE
Cholesterol, Total: 155 mg/dL (ref 100–199)
HDL Particle Number: 37.9 umol/L (ref >=30.5)
HDL-C: 39 mg/dL — ABNORMAL LOW (ref >39)
LDL Particle Number: 1006 nmol/L — ABNORMAL HIGH (ref <1000)
LDL Size: 19.6 nm — ABNORMAL LOW (ref >20.5)
LDL-C (NIH Calc): 64 mg/dL (ref 0–99)
LP-IR Score: 81 — ABNORMAL HIGH (ref <=45)
Small LDL Particle Number: 772 nmol/L — ABNORMAL HIGH (ref <=527)
Triglycerides: 334 mg/dL — ABNORMAL HIGH (ref 0–149)

## 2024-04-19 LAB — VITAMIN D 1,25 DIHYDROXY
Vitamin D 1, 25 (OH)2 Total: 31 pg/mL
Vitamin D2 1, 25 (OH)2: <10 pg/mL
Vitamin D3 1, 25 (OH)2: 30 pg/mL

## 2024-04-19 LAB — LIPOPROTEIN A (LPA): Lipoprotein (a): <8.4 nmol/L (ref <75.0)

## 2024-04-19 NOTE — Telephone Encounter (Signed)
     Pharmacy Patient Advocate Encounter   Received notification from Pt Calls Messages that prior authorization for icosapent is required/requested.   Insurance verification completed.   The patient is insured through [facility].   Per test claim: PA required; PA submitted to above mentioned insurance via Latent Key/confirmation #/EOC [id number] Status is pending

## 2024-04-22 NOTE — Telephone Encounter (Signed)
   But he is statin intolerant. Sent ins more info

## 2024-04-23 ENCOUNTER — Other Ambulatory Visit (HOSPITAL_COMMUNITY): Payer: Self-pay

## 2024-04-26 ENCOUNTER — Other Ambulatory Visit (HOSPITAL_COMMUNITY): Payer: Self-pay

## 2024-04-27 ENCOUNTER — Other Ambulatory Visit (HOSPITAL_COMMUNITY): Payer: Self-pay

## 2024-04-28 ENCOUNTER — Other Ambulatory Visit (HOSPITAL_COMMUNITY): Payer: Self-pay

## 2024-04-28 NOTE — Telephone Encounter (Signed)
 Sent more info to ins [phone]

## 2024-04-30 ENCOUNTER — Other Ambulatory Visit (HOSPITAL_COMMUNITY): Payer: Self-pay

## 2024-04-30 NOTE — Telephone Encounter (Signed)
 I called [phone] and they said this is still under review with no timeline of when it will be reviewed

## 2024-05-03 ENCOUNTER — Other Ambulatory Visit (HOSPITAL_COMMUNITY): Payer: Self-pay

## 2024-05-04 ENCOUNTER — Other Ambulatory Visit (HOSPITAL_COMMUNITY): Payer: Self-pay

## 2024-05-05 ENCOUNTER — Encounter: Payer: Self-pay | Admitting: Internal Medicine

## 2024-05-05 ENCOUNTER — Other Ambulatory Visit (HOSPITAL_COMMUNITY): Payer: Self-pay

## 2024-05-05 DIAGNOSIS — E782 Mixed hyperlipidemia: Secondary | ICD-10-CM

## 2024-05-05 DIAGNOSIS — I2510 Atherosclerotic heart disease of native coronary artery without angina pectoris: Secondary | ICD-10-CM

## 2024-05-05 NOTE — Telephone Encounter (Signed)
 Patient needs to have A1C checked   given increased trigs   I cannot find level Will need to review before thinking of other strategies to lower  Work on diet  Cut out carbs

## 2024-05-07 ENCOUNTER — Other Ambulatory Visit (HOSPITAL_COMMUNITY): Payer: Self-pay

## 2024-05-07 NOTE — Telephone Encounter (Signed)
 Hi,  I called insurance and they said the appeal of the icosapent was now approved: Approval dates are now from: 04/30/24-04/30/25. If someone can send that prescription to his pharmacy now, they will be able to fill it. They said his copay would 112.93 for 30 days per insurance. Thank you!       If cost is an issue: There is no coupon available for the icosapent and since he has commercial he isn't eligible for a grant. Goodrx may be cheaper per search at [facility]. Patient uses [facility]

## 2024-05-09 MED ORDER — ICOSAPENT ETHYL 1 G PO CAPS: 2.0000 g | ORAL_CAPSULE | Freq: Two times a day (BID) | ORAL | 11 refills | Status: DC

## 2024-05-09 NOTE — Addendum Note (Signed)
 Addended by: Ayano Douthitt L on: 05/09/2024 09:40 AM   Modules accepted: Orders

## 2024-05-11 ENCOUNTER — Other Ambulatory Visit: Payer: Self-pay | Admitting: Internal Medicine

## 2024-05-11 MED ORDER — ICOSAPENT ETHYL 1 G PO CAPS: 2.0000 g | ORAL_CAPSULE | Freq: Two times a day (BID) | ORAL | 11 refills | Status: AC

## 2024-05-11 NOTE — Telephone Encounter (Signed)
 Fax rec'vd from pharmacy [facility] requesting to switch to generic Lovaza. Fax scanned to media

## 2024-05-21 NOTE — Telephone Encounter (Signed)
 Patient on Vascepa. Generic Rx previously sent in.

## 2024-08-11 ENCOUNTER — Other Ambulatory Visit: Payer: Self-pay | Admitting: Internal Medicine

## 2024-08-11 DIAGNOSIS — I2510 Atherosclerotic heart disease of native coronary artery without angina pectoris: Secondary | ICD-10-CM

## 2024-08-11 DIAGNOSIS — E782 Mixed hyperlipidemia: Secondary | ICD-10-CM
